# Patient Record
Sex: Male | Born: 1965 | Hispanic: No | Marital: Married | Smoking: Never smoker
Health system: Southern US, Community
[De-identification: ages and names within clinical notes are randomized; demographics above are authoritative.]

## PROBLEM LIST (undated history)

## (undated) DIAGNOSIS — I10 Essential (primary) hypertension: Secondary | ICD-10-CM

## (undated) DIAGNOSIS — E785 Hyperlipidemia, unspecified: Secondary | ICD-10-CM

## (undated) HISTORY — PX: CHOLECYSTECTOMY: SHX55

## (undated) HISTORY — DX: Hyperlipidemia, unspecified: E78.5

## (undated) HISTORY — PX: MOUTH SURGERY: SHX715

---

## 2000-09-27 ENCOUNTER — Emergency Department (HOSPITAL_COMMUNITY): Admission: EM | Admit: 2000-09-27 | Discharge: 2000-09-27 | Payer: Self-pay

## 2002-08-17 ENCOUNTER — Encounter: Payer: Self-pay | Admitting: Emergency Medicine

## 2002-08-17 ENCOUNTER — Emergency Department (HOSPITAL_COMMUNITY): Admission: AC | Admit: 2002-08-17 | Discharge: 2002-08-18 | Payer: Self-pay

## 2008-08-17 ENCOUNTER — Emergency Department (HOSPITAL_COMMUNITY): Admission: EM | Admit: 2008-08-17 | Discharge: 2008-08-17 | Payer: Self-pay | Admitting: Emergency Medicine

## 2009-08-04 ENCOUNTER — Inpatient Hospital Stay (HOSPITAL_COMMUNITY): Admission: EM | Admit: 2009-08-04 | Discharge: 2009-08-06 | Payer: Self-pay | Admitting: Emergency Medicine

## 2009-08-05 ENCOUNTER — Encounter (INDEPENDENT_AMBULATORY_CARE_PROVIDER_SITE_OTHER): Payer: Self-pay | Admitting: Surgery

## 2010-12-09 LAB — DIFFERENTIAL
Basophils Absolute: 0.1 10*3/uL (ref 0.0–0.1)
Eosinophils Relative: 1 % (ref 0–5)
Lymphocytes Relative: 19 % (ref 12–46)
Lymphs Abs: 1.5 10*3/uL (ref 0.7–4.0)
Neutro Abs: 6 10*3/uL (ref 1.7–7.7)
Neutrophils Relative %: 73 % (ref 43–77)

## 2010-12-09 LAB — URINALYSIS, ROUTINE W REFLEX MICROSCOPIC
Bilirubin Urine: NEGATIVE
Ketones, ur: 15 mg/dL — AB
Leukocytes, UA: NEGATIVE
Nitrite: NEGATIVE
Protein, ur: NEGATIVE mg/dL

## 2010-12-09 LAB — COMPREHENSIVE METABOLIC PANEL
AST: 23 U/L (ref 0–37)
BUN: 11 mg/dL (ref 6–23)
CO2: 26 mEq/L (ref 19–32)
Calcium: 8.7 mg/dL (ref 8.4–10.5)
Creatinine, Ser: 1 mg/dL (ref 0.4–1.5)
GFR calc Af Amer: >60 mL/min (ref >60)
GFR calc non Af Amer: >60 mL/min (ref >60)

## 2010-12-09 LAB — LIPASE, BLOOD: Lipase: 18 U/L (ref 11–59)

## 2010-12-09 LAB — CBC
HCT: 52.8 % — ABNORMAL HIGH (ref 39.0–52.0)
MCHC: 34.4 g/dL (ref 30.0–36.0)
MCV: 87.2 fL (ref 78.0–100.0)
RBC: 6.06 MIL/uL — ABNORMAL HIGH (ref 4.22–5.81)

## 2010-12-09 LAB — PROTIME-INR
INR: 1.1 (ref 0.00–1.49)
Prothrombin Time: 14.1 seconds (ref 11.6–15.2)

## 2010-12-09 LAB — APTT: aPTT: 33 seconds (ref 24–37)

## 2011-06-11 LAB — COMPREHENSIVE METABOLIC PANEL
ALT: 37 U/L (ref 0–53)
AST: 28 U/L (ref 0–37)
Albumin: 4.5 g/dL (ref 3.5–5.2)
CO2: 28 mEq/L (ref 19–32)
Calcium: 9.1 mg/dL (ref 8.4–10.5)
Creatinine, Ser: 0.89 mg/dL (ref 0.4–1.5)
GFR calc Af Amer: >60 mL/min (ref >60)
Sodium: 137 mEq/L (ref 135–145)

## 2011-06-11 LAB — CBC
MCHC: 32.7 g/dL (ref 30.0–36.0)
MCV: 88.8 fL (ref 78.0–100.0)
Platelets: 294 10*3/uL (ref 150–400)
RBC: 5.85 MIL/uL — ABNORMAL HIGH (ref 4.22–5.81)
WBC: 10.1 10*3/uL (ref 4.0–10.5)

## 2011-06-11 LAB — DIFFERENTIAL
Eosinophils Absolute: 0 10*3/uL (ref 0.0–0.7)
Eosinophils Relative: 0 % (ref 0–5)
Lymphocytes Relative: 5 % — ABNORMAL LOW (ref 12–46)
Lymphs Abs: 0.5 10*3/uL — ABNORMAL LOW (ref 0.7–4.0)
Monocytes Absolute: 0.1 10*3/uL (ref 0.1–1.0)
Monocytes Relative: 1 % — ABNORMAL LOW (ref 3–12)

## 2011-06-11 LAB — URINALYSIS, ROUTINE W REFLEX MICROSCOPIC
Bilirubin Urine: NEGATIVE
Nitrite: NEGATIVE
Specific Gravity, Urine: 1.024 (ref 1.005–1.030)
Urobilinogen, UA: 1 mg/dL (ref 0.0–1.0)
pH: 7.5 (ref 5.0–8.0)

## 2011-06-11 LAB — URINE MICROSCOPIC-ADD ON

## 2011-08-16 ENCOUNTER — Ambulatory Visit: Payer: Self-pay

## 2012-01-30 ENCOUNTER — Inpatient Hospital Stay (HOSPITAL_COMMUNITY)
Admission: EM | Admit: 2012-01-30 | Discharge: 2012-02-02 | DRG: 566 | Disposition: A | Discharge status: Home / Self Care | Payer: BC Managed Care – PPO | Source: Ambulatory Visit | Attending: Internal Medicine | Admitting: Internal Medicine

## 2012-01-30 ENCOUNTER — Encounter (HOSPITAL_COMMUNITY): Payer: Self-pay | Admitting: *Deleted

## 2012-01-30 DIAGNOSIS — N179 Acute kidney failure, unspecified: Secondary | ICD-10-CM | POA: Diagnosis present

## 2012-01-30 DIAGNOSIS — N12 Tubulo-interstitial nephritis, not specified as acute or chronic: Secondary | ICD-10-CM

## 2012-01-30 DIAGNOSIS — R6510 Systemic inflammatory response syndrome (SIRS) of non-infectious origin without acute organ dysfunction: Secondary | ICD-10-CM | POA: Diagnosis present

## 2012-01-30 DIAGNOSIS — I10 Essential (primary) hypertension: Secondary | ICD-10-CM | POA: Diagnosis present

## 2012-01-30 DIAGNOSIS — R7881 Bacteremia: Secondary | ICD-10-CM | POA: Diagnosis present

## 2012-01-30 DIAGNOSIS — E860 Dehydration: Principal | ICD-10-CM | POA: Diagnosis present

## 2012-01-30 DIAGNOSIS — K5289 Other specified noninfective gastroenteritis and colitis: Secondary | ICD-10-CM | POA: Diagnosis present

## 2012-01-30 HISTORY — DX: Essential (primary) hypertension: I10

## 2012-01-30 MED ORDER — MORPHINE SULFATE 4 MG/ML IJ SOLN
4.0000 mg | Freq: Once | INTRAMUSCULAR | Status: AC
  Administered 2012-01-31: 4 mg via INTRAVENOUS
  Filled 2012-01-30: qty 1

## 2012-01-30 MED ORDER — SODIUM CHLORIDE 0.9 % IV BOLUS (SEPSIS)
1000.0000 mL | Freq: Once | INTRAVENOUS | Status: AC
  Administered 2012-01-31: 1000 mL via INTRAVENOUS

## 2012-01-30 MED ORDER — ONDANSETRON HCL 4 MG/2ML IJ SOLN
4.0000 mg | Freq: Once | INTRAMUSCULAR | Status: AC
  Administered 2012-01-31: 4 mg via INTRAVENOUS
  Filled 2012-01-30: qty 2

## 2012-01-30 NOTE — ED Provider Notes (Signed)
History     CSN: [id number]  Arrival date & time 01/30/12  2227   First MD Initiated Contact with Patient 01/30/12 2335      Chief Complaint  Patient presents with  . Abdominal Pain    (Consider location/radiation/quality/duration/timing/severity/associated sxs/prior treatment) HPI Comments: Pt denies vomiting, bloody stool:pt states that he drinks seldomly:pt states that he has had a decreased appetite:pt states that he has had chills  Patient is a 46 y.o. male presenting with abdominal pain. The history is provided by the patient. No language interpreter was used.  Abdominal Pain The primary symptoms of the illness include abdominal pain and diarrhea. The primary symptoms of the illness do not include fever, nausea or vomiting. The current episode started more than 2 days ago. The onset of the illness was gradual. The problem has not changed since onset.   History reviewed. No pertinent past medical history.  History reviewed. No pertinent past surgical history.  No family history on file.  History  Substance Use Topics  . Smoking status: Never Smoker   . Smokeless tobacco: Not on file  . Alcohol Use: Yes      Review of Systems  Constitutional: Negative for fever.  Respiratory: Negative.   Cardiovascular: Negative.   Gastrointestinal: Positive for abdominal pain and diarrhea. Negative for nausea and vomiting.  Neurological: Negative.     Allergies  Review of patient's allergies indicates no known allergies.  Home Medications   Current Outpatient Rx  Name Route Sig Dispense Refill  . LISINOPRIL-HYDROCHLOROTHIAZIDE 10-12.5 MG PO TABS Oral Take 1 tablet by mouth daily.      BP 120/73  Pulse 113  Temp(Src) 99.2 F (37.3 C) (Oral)  Resp 21  SpO2 96%  Physical Exam  Nursing note and vitals reviewed. Constitutional: He appears well-developed and well-nourished.  HENT:  Head: Normocephalic.  Eyes: EOM are normal. Pupils are equal, round, and reactive to  light.  Neck: Normal range of motion. Neck supple.  Cardiovascular: Normal rate and regular rhythm.   Pulmonary/Chest: Effort normal and breath sounds normal.  Abdominal: Soft. Bowel sounds are normal.       Pt has rlq:no guarding or rebound  Musculoskeletal: He exhibits edema.  Skin: Skin is warm and dry.  Psychiatric: He has a normal mood and affect.    ED Course  Procedures (including critical care time)  Labs Reviewed  COMPREHENSIVE METABOLIC PANEL - Abnormal; Notable for the following:    Glucose, Bld 150 (*)    BUN 37 (*)    Creatinine, Ser 2.56 (*)    Albumin 3.2 (*)    AST 57 (*)    GFR calc non Af Amer 29 (*)    GFR calc Af Amer 33 (*)    All other components within normal limits  CBC - Abnormal; Notable for the following:    WBC 18.5 (*)    Hemoglobin 17.7 (*)    All other components within normal limits  DIFFERENTIAL - Abnormal; Notable for the following:    Neutrophils Relative 88 (*)    Neutro Abs 16.3 (*)    Lymphocytes Relative 4 (*)    Monocytes Absolute 1.4 (*)    All other components within normal limits  LIPASE, BLOOD  URINALYSIS, ROUTINE W REFLEX MICROSCOPIC   No results found.   No diagnosis found.    MDM  Pt care to be followed with Dr.King:pt waiting on ct:pt likely will be admitted related to kidney function  Teressa Lower, NP 01/31/12 [phone]

## 2012-01-30 NOTE — ED Notes (Signed)
The pt has had abd pain with nv  Diarrhea for 5 days

## 2012-01-31 ENCOUNTER — Emergency Department (HOSPITAL_COMMUNITY): Payer: BC Managed Care – PPO

## 2012-01-31 ENCOUNTER — Encounter (HOSPITAL_COMMUNITY): Payer: Self-pay | Admitting: Nurse Practitioner

## 2012-01-31 DIAGNOSIS — R5381 Other malaise: Secondary | ICD-10-CM

## 2012-01-31 DIAGNOSIS — R509 Fever, unspecified: Secondary | ICD-10-CM

## 2012-01-31 DIAGNOSIS — R1084 Generalized abdominal pain: Secondary | ICD-10-CM

## 2012-01-31 DIAGNOSIS — D7289 Other specified disorders of white blood cells: Secondary | ICD-10-CM

## 2012-01-31 LAB — COMPREHENSIVE METABOLIC PANEL
Albumin: 3.2 g/dL — ABNORMAL LOW (ref 3.5–5.2)
Alkaline Phosphatase: 57 U/L (ref 39–117)
BUN: 37 mg/dL — ABNORMAL HIGH (ref 6–23)
CO2: 25 mEq/L (ref 19–32)
Chloride: 96 mEq/L (ref 96–112)
GFR calc non Af Amer: 29 mL/min — ABNORMAL LOW (ref >90)
Potassium: 4.1 mEq/L (ref 3.5–5.1)
Total Bilirubin: 0.9 mg/dL (ref 0.3–1.2)

## 2012-01-31 LAB — DIFFERENTIAL
Basophils Absolute: 0.1 10*3/uL (ref 0.0–0.1)
Basophils Relative: 0 % (ref 0–1)
Eosinophils Relative: 0 % (ref 0–5)
Monocytes Absolute: 1.4 10*3/uL — ABNORMAL HIGH (ref 0.1–1.0)
Neutro Abs: 16.3 10*3/uL — ABNORMAL HIGH (ref 1.7–7.7)

## 2012-01-31 LAB — HIV ANTIBODY (ROUTINE TESTING W REFLEX): HIV: NONREACTIVE

## 2012-01-31 LAB — URINALYSIS, ROUTINE W REFLEX MICROSCOPIC
Glucose, UA: NEGATIVE mg/dL
Ketones, ur: 15 mg/dL — AB
Nitrite: NEGATIVE
Specific Gravity, Urine: 1.025 (ref 1.005–1.030)
pH: 5 (ref 5.0–8.0)

## 2012-01-31 LAB — CBC
HCT: 46.7 % (ref 39.0–52.0)
HCT: 50.5 % (ref 39.0–52.0)
Hemoglobin: 16 g/dL (ref 13.0–17.0)
MCHC: 34.3 g/dL (ref 30.0–36.0)
MCHC: 35 g/dL (ref 30.0–36.0)
MCV: 86.8 fL (ref 78.0–100.0)
MCV: 87.1 fL (ref 78.0–100.0)
Platelets: 191 10*3/uL (ref 150–400)
RDW: 13.3 % (ref 11.5–15.5)

## 2012-01-31 LAB — SODIUM, URINE, RANDOM: Sodium, Ur: 28 mEq/L

## 2012-01-31 LAB — BASIC METABOLIC PANEL
BUN: 40 mg/dL — ABNORMAL HIGH (ref 6–23)
Chloride: 96 mEq/L (ref 96–112)
Creatinine, Ser: 2.15 mg/dL — ABNORMAL HIGH (ref 0.50–1.35)
GFR calc non Af Amer: 35 mL/min — ABNORMAL LOW (ref >90)
Glucose, Bld: 92 mg/dL (ref 70–99)
Potassium: 3.6 mEq/L (ref 3.5–5.1)

## 2012-01-31 LAB — URINE MICROSCOPIC-ADD ON

## 2012-01-31 LAB — RAPID URINE DRUG SCREEN, HOSP PERFORMED
Barbiturates: NOT DETECTED
Benzodiazepines: NOT DETECTED

## 2012-01-31 LAB — LIPASE, BLOOD: Lipase: 28 U/L (ref 11–59)

## 2012-01-31 LAB — GLUCOSE, CAPILLARY
Glucose-Capillary: 105 mg/dL — ABNORMAL HIGH (ref 70–99)
Glucose-Capillary: 102 mg/dL — ABNORMAL HIGH (ref 70–99)

## 2012-01-31 LAB — HEMOGLOBIN A1C
Hgb A1c MFr Bld: 5.3 % (ref <5.7)
Mean Plasma Glucose: 105 mg/dL (ref <117)

## 2012-01-31 LAB — CLOSTRIDIUM DIFFICILE BY PCR: Toxigenic C. Difficile by PCR: NEGATIVE

## 2012-01-31 LAB — CREATININE, URINE, RANDOM: Creatinine, Urine: 226.73 mg/dL

## 2012-01-31 MED ORDER — METRONIDAZOLE IN NACL 5-0.79 MG/ML-% IV SOLN
500.0000 mg | Freq: Three times a day (TID) | INTRAVENOUS | Status: DC
  Filled 2012-01-31 (×2): qty 100

## 2012-01-31 MED ORDER — ONDANSETRON HCL 4 MG PO TABS
4.0000 mg | ORAL_TABLET | Freq: Four times a day (QID) | ORAL | Status: DC | PRN
  Filled 2012-01-31: qty 0.5

## 2012-01-31 MED ORDER — DEXTROSE 5 % IV SOLN: 1.0000 g | INTRAVENOUS | Status: DC

## 2012-01-31 MED ORDER — VANCOMYCIN 50 MG/ML ORAL SOLUTION
125.0000 mg | Freq: Four times a day (QID) | ORAL | Status: DC
  Administered 2012-01-31: 125 mg via ORAL
  Filled 2012-01-31 (×4): qty 2.5

## 2012-01-31 MED ORDER — MORPHINE SULFATE 2 MG/ML IJ SOLN: 1.0000 mg | INTRAMUSCULAR | Status: DC | PRN

## 2012-01-31 MED ORDER — CIPROFLOXACIN IN D5W 400 MG/200ML IV SOLN
400.0000 mg | Freq: Two times a day (BID) | INTRAVENOUS | Status: DC
  Administered 2012-01-31 – 2012-02-01 (×4): 400 mg via INTRAVENOUS
  Filled 2012-01-31 (×6): qty 200

## 2012-01-31 MED ORDER — ACETAMINOPHEN 325 MG PO TABS
650.0000 mg | ORAL_TABLET | Freq: Four times a day (QID) | ORAL | Status: DC | PRN
  Administered 2012-01-31 – 2012-02-02 (×5): 650 mg via ORAL
  Filled 2012-01-31 (×5): qty 2

## 2012-01-31 MED ORDER — ENOXAPARIN SODIUM 30 MG/0.3ML ~~LOC~~ SOLN
30.0000 mg | SUBCUTANEOUS | Status: DC
  Administered 2012-01-31 – 2012-02-01 (×2): 30 mg via SUBCUTANEOUS
  Filled 2012-01-31 (×4): qty 0.3

## 2012-01-31 MED ORDER — METRONIDAZOLE IN NACL 5-0.79 MG/ML-% IV SOLN
500.0000 mg | Freq: Three times a day (TID) | INTRAVENOUS | Status: DC
  Administered 2012-01-31 – 2012-02-02 (×6): 500 mg via INTRAVENOUS
  Filled 2012-01-31 (×8): qty 100

## 2012-01-31 MED ORDER — DEXTROSE 5 % IV SOLN
1.0000 g | Freq: Once | INTRAVENOUS | Status: AC
  Administered 2012-01-31: 1 g via INTRAVENOUS
  Filled 2012-01-31: qty 10

## 2012-01-31 MED ORDER — HYDROCODONE-ACETAMINOPHEN 5-325 MG PO TABS
1.0000 | ORAL_TABLET | ORAL | Status: DC | PRN
  Filled 2012-01-31: qty 2

## 2012-01-31 MED ORDER — SODIUM CHLORIDE 0.9 % IV SOLN
INTRAVENOUS | Status: DC
  Administered 2012-01-31: 06:00:00 via INTRAVENOUS
  Administered 2012-01-31: 125 mL/h via INTRAVENOUS
  Administered 2012-02-01: 02:00:00 via INTRAVENOUS
  Administered 2012-02-01: 1000 mL via INTRAVENOUS
  Administered 2012-02-01: 13:00:00 via INTRAVENOUS

## 2012-01-31 MED ORDER — ONDANSETRON HCL 4 MG/2ML IJ SOLN: 4.0000 mg | Freq: Four times a day (QID) | INTRAMUSCULAR | Status: DC | PRN

## 2012-01-31 MED ORDER — ACETAMINOPHEN 325 MG PO TABS
650.0000 mg | ORAL_TABLET | Freq: Once | ORAL | Status: AC
  Administered 2012-01-31: 650 mg via ORAL
  Filled 2012-01-31: qty 2

## 2012-01-31 MED ORDER — IOHEXOL 300 MG/ML  SOLN
20.0000 mL | INTRAMUSCULAR | Status: DC
  Administered 2012-01-31: 20 mL via ORAL

## 2012-01-31 NOTE — ED Provider Notes (Signed)
Medical screening examination/treatment/procedure(s) were performed by non-physician practitioner and as supervising physician I was immediately available for consultation/collaboration.   Dayton Bailiff, MD 01/31/12 [phone]

## 2012-01-31 NOTE — Progress Notes (Addendum)
Subjective: Seen and examined this morning. He informs of  having abdominal pain mainly in the lower quadrants. Denies having any flank pain or urinary symptoms. He informs of having about 9-10 episodes of watery diarrhea for past 3 days and feeling very nauseous at the same time. Loss of chills. Denies any sick contacts or recent travel. Denies being on antibiotics recently.  Objective:  Vital signs in last 24 hours:  Filed Vitals:   01/30/12 2236 01/31/12 0358 01/31/12 0530 01/31/12 0938  BP: 120/73 106/59 108/66 104/56  Pulse: 113 103 88 102  Temp: 99.2 F (37.3 C) 102.4 F (39.1 C) 98.4 F (36.9 C) 102.9 F (39.4 C)  TempSrc: Oral Oral Oral Oral  Resp: 21 20 20 18   Height:   5\' 10"  (1.778 m)   Weight:   95.029 kg (209 lb 8 oz)   SpO2: 96% 96% 97% 98%    Intake/Output from previous day:  No intake or output data in the 24 hours ending 01/31/12 1329  Physical Exam:  General: Middle-aged male in no acute distress. HEENT: no pallor, no icterus, dry oral mucosa, no JVD, no lymphadenopathy Heart: Normal  s1 &s2  Regular rate and rhythm, without murmurs, rubs, gallops. Lungs: Clear to auscultation bilaterally. Abdomen: Soft, mild tenderness to palpation over right lower quadrant, nondistended, positive bowel sounds. Extremities: No clubbing cyanosis or edema with positive pedal pulses. Neuro: Alert, awake, oriented x3, nonfocal.   Lab Results:  Basic Metabolic Panel:    Component Value Date/Time   NA 136 01/30/2012 2341   K 4.1 01/30/2012 2341   CL 96 01/30/2012 2341   CO2 25 01/30/2012 2341   BUN 37* 01/30/2012 2341   CREATININE 2.56* 01/30/2012 2341   GLUCOSE 150* 01/30/2012 2341   CALCIUM 9.1 01/30/2012 2341   CBC:    Component Value Date/Time   WBC 16.9* 01/31/2012 0950   HGB 16.0 01/31/2012 0950   HCT 46.7 01/31/2012 0950   PLT 208 01/31/2012 0950   MCV 86.8 01/31/2012 0950   NEUTROABS 16.3* 01/30/2012 2341   LYMPHSABS 0.8 01/30/2012 2341   MONOABS 1.4* 01/30/2012 2341    EOSABS 0.0 01/30/2012 2341   BASOSABS 0.1 01/30/2012 2341    Recent Results (from the past 240 hour(s))  CLOSTRIDIUM DIFFICILE BY PCR     Status: Normal   Collection Time   01/31/12 10:33 AM      Component Value Range Status Comment   C difficile by pcr NEGATIVE  NEGATIVE  Final     Studies/Results: Ct Abdomen Pelvis Wo Contrast  01/31/2012  *RADIOLOGY REPORT*  Clinical Data: Decreased appetite.  Chills.  Abdominal pain. Diarrhea.  CT ABDOMEN AND PELVIS WITHOUT CONTRAST  Technique:  Multidetector CT imaging of the abdomen and pelvis was performed following the standard protocol without intravenous contrast.  Comparison: 08/04/2009  Findings: The lung bases are clear.  There is an infiltrative process involving the inferior retroperitoneal fat and extending along the iliac vessels into the pelvis with presacral and perirectal involvement.  There appear to be enlarged perirectal lymph nodes.  This is likely represent inflammatory stranding although retroperitoneal hematoma can also have this appearance. The appendix is normal.  There is no obvious evidence of diverticulitis.  The abdominal aorta is normal in caliber.  No lumbar endplate bone destruction to suggest diskitis. The source of the inflammatory processes is clear. No pyelocaliectasis or ureterectasis.  Changes are new since the previous study.  The surgical absence of the gallbladder.  Enlarged lymph node  in the para-esophageal fat at the abdominal inlet measuring 12 mm diameter.  The unenhanced appearance of the liver, spleen, pancreas, adrenal glands, kidneys, abdominal aorta, and retroperitoneal lymph nodes is unremarkable.  The stomach and small bowel are not distended.  No free air or free fluid in the abdomen.  Pelvis:  In addition to the previously described findings, the prostate gland appears mildly enlarged.  The bladder wall is not thickened.  No free or loculated pelvic fluid collections.  Normal alignment of the lumbar vertebra  with mild degenerative change.  IMPRESSION: Inferior retroperitoneal, pelvic, and perirectal stranding consistent with inflammatory process or hematoma.  The source is not identified.  The appendix and sigmoid colon are unremarkable. There is evidence of prominent lymph nodes in the perirectal fat and adjacent to the distal esophagus.  Original Report Authenticated By: Marlon Pel, M.D.    Medications: Scheduled Meds:   . acetaminophen  650 mg Oral Once  . cefTRIAXone (ROCEPHIN)  IV  1 g Intravenous Once  . ciprofloxacin  400 mg Intravenous Q12H  . enoxaparin  30 mg Subcutaneous Q24H  .  morphine injection  4 mg Intravenous Once  . ondansetron (ZOFRAN) IV  4 mg Intravenous Once  . sodium chloride  1,000 mL Intravenous Once  . vancomycin  125 mg Oral QID  . DISCONTD: cefTRIAXone (ROCEPHIN)  IV  1 g Intravenous Q24H  . DISCONTD: iohexol  20 mL Oral Q1 Hr x 2  . DISCONTD: metronidazole  500 mg Intravenous Q8H   Continuous Infusions:   . sodium chloride 125 mL/hr at 01/31/12 0600   PRN Meds:.acetaminophen, HYDROcodone-acetaminophen, morphine injection, ondansetron (ZOFRAN) IV, ondansetron  Assessment/  46 year old male with history off hypertension presenting with 3 days off abdominal pain mainly involving the lower quadrant associated with several episodes of watery diarrhea and the nausea. Patient noted to be febrile in along with significant leukocytosis with predominant neutrophils with CT abdomen and pelvis showing inferior retroperitoneal, pelvic, and perirectal stranding consistent with an inflammatory process without a definite source.   Plan:  SIRS [systemic inflammatory response syndrome] Patient hasn't high fever with mild tachycardia and significant leukocytosis meeting criteria for SIRS Symptoms possibly related to acute enteritis/enterocolitis Patient was initiated on IV Rocephin and with concern for pyelonephritis. However this does not have any urinary symptoms  and UA is not convincing of a UTI or CVA tenderness on exam. He was quite febrile even this morning. Will change the antibiotic coverage to IV ciprofloxacin and Flagyl His stool for C. difficile is negative, stool culture and stool for ova parasites is pending Ordered blood culture and urine culture The new with IV hydration and Tylenol as needed for fever IV Zofran for nausea  Acute kidney injury Possibly related to dehydration Fe N.a pending Monitor urine output and follow with renal function And on HCTZ and lisinopril for this or pressure which I will hold  Hypertension Hold  Blood pressure medication  Diet clear liquids  D V T prophylaxis  Full code    LOS: 1 day   Gwenith Tschida 01/31/2012, 1:29 PM   FeNa of 0.23

## 2012-01-31 NOTE — H&P (Signed)
PCP:  Sheila Oats, MD, MD   DOA:  01/30/2012 10:33 PM  Chief Complaint:  Abdominal pain  HPI: Pt is 45 yo male who presents to [facility] ED with main concern of progressively worsening generalized lower quadrant, dull abdominal pain, intermittent in nature, radiating to bilateral flank area, 10/10 in severity when present with no specific aggravating or alleviating factors. The episodes have been associated with subjective fevers, chills, nauseam poor oral intake, dysuria and sensation of incomplete voiding. Pt denies similar episodes in the past. He also denies any recent sicknesses of hospitalizations, no sick contacts or exposures, no other systemic symptoms of weight loss or gain. Denies headaches, visual changes.  Allergies: No Known Allergies  Prior to Admission medications   Medication Sig Start Date End Date Taking? Authorizing Provider  lisinopril-hydrochlorothiazide (PRINZIDE,ZESTORETIC) 10-12.5 MG per tablet Take 1 tablet by mouth daily.   Yes Historical Provider, MD    Past Medical History  Diagnosis Date  . Hypertension     Past Surgical History  Procedure Date  . Cholecystectomy     Social History:  reports that he has never smoked. He does not have any smokeless tobacco history on file. He reports that he drinks alcohol. His drug history not on file.  No family history on file.  Review of Systems:  Constitutional: Denies fatigue HEENT: Denies photophobia, eye pain, redness, hearing loss, ear pain, congestion, sore throat, rhinorrhea, sneezing, mouth sores, trouble swallowing, neck pain, neck stiffness and tinnitus.   Respiratory: Denies SOB, DOE, cough, chest tightness,  and wheezing.   Cardiovascular: Denies chest pain, palpitations and leg swelling.  Gastrointestinal: Denies constipation, blood in stool and abdominal distention.  Genitourinary: per HPI Musculoskeletal: Denies myalgias, back pain, joint swelling, arthralgias and gait problem.  Skin: Denies  pallor, rash and wound.  Neurological: Denies dizziness, seizures, syncope, weakness, light-headedness, numbness and headaches.  Hematological: Denies adenopathy. Easy bruising, personal or family bleeding history  Psychiatric/Behavioral: Denies suicidal ideation, mood changes, confusion, nervousness, sleep disturbance and agitation   Physical Exam:  Filed Vitals:   01/30/12 2236 01/31/12 0358  BP: 120/73 106/59  Pulse: 113 103  Temp: 99.2 F (37.3 C) 102.4 F (39.1 C)  TempSrc: Oral Oral  Resp: 21 20  SpO2: 96% 96%    Constitutional: Vital signs reviewed.  Patient is in no acute distress and cooperative with exam. Alert and oriented x3.  Head: Normocephalic and atraumatic Ear: TM normal bilaterally Mouth: no erythema or exudates, MMM Eyes: PERRL, EOMI, conjunctivae normal, No scleral icterus.  Neck: Supple, Trachea midline normal ROM, No JVD, mass, thyromegaly, or carotid bruit present.  Cardiovascular: Regular rhythm but tachycardic, S1 normal, S2 normal, no MRG, pulses symmetric and intact bilaterally Pulmonary/Chest: CTAB, no wheezes, rales, or rhonchi Abdominal: Soft. Tender in epigastric area, non-distended, bowel sounds are normal, no masses, organomegaly, or guarding present.  GU: bilateral mild CVA tenderness Musculoskeletal: No oint deformities, erythema, or stiffness, ROM full and no nontender Ext: no edema and no cyanosis, pulses palpable bilaterally (DP and PT) Hematology: no cervical, inginal, or axillary adenopathy.  Neurological: A&O x3, Strenght is normal and symmetric bilaterally, cranial nerve II-XII are grossly intact, no focal motor deficit, sensory intact to light touch bilaterally.  Skin: Warm, dry and intact. No rash, cyanosis, or clubbing.  Psychiatric: Normal mood and affect. speech and behavior is normal. Judgment and thought content normal. Cognition and memory are normal.   Labs on Admission:  Results for orders placed during the hospital encounter  of 01/30/12 (from  the past 48 hour(s))  COMPREHENSIVE METABOLIC PANEL     Status: Abnormal   Collection Time   01/30/12 11:41 PM      Component Value Range Comment   Sodium 136  135 - 145 (mEq/L)    Potassium 4.1  3.5 - 5.1 (mEq/L)    Chloride 96  96 - 112 (mEq/L)    CO2 25  19 - 32 (mEq/L)    Glucose, Bld 150 (*) 70 - 99 (mg/dL)    BUN 37 (*) 6 - 23 (mg/dL)    Creatinine, Ser 8.29 (*) 0.50 - 1.35 (mg/dL)    Calcium 9.1  8.4 - 10.5 (mg/dL)    Total Protein 7.9  6.0 - 8.3 (g/dL)    Albumin 3.2 (*) 3.5 - 5.2 (g/dL)    AST 57 (*) 0 - 37 (U/L)    ALT 44  0 - 53 (U/L)    Alkaline Phosphatase 57  39 - 117 (U/L)    Total Bilirubin 0.9  0.3 - 1.2 (mg/dL)    GFR calc non Af Amer 29 (*) >90 (mL/min)    GFR calc Af Amer 33 (*) >90 (mL/min)   LIPASE, BLOOD     Status: Normal   Collection Time   01/30/12 11:41 PM      Component Value Range Comment   Lipase 28  11 - 59 (U/L)   CBC     Status: Abnormal   Collection Time   01/30/12 11:41 PM      Component Value Range Comment   WBC 18.5 (*) 4.0 - 10.5 (K/uL)    RBC 5.80  4.22 - 5.81 (MIL/uL)    Hemoglobin 17.7 (*) 13.0 - 17.0 (g/dL)    HCT 56.2  13.0 - 86.5 (%)    MCV 87.1  78.0 - 100.0 (fL)    MCH 30.5  26.0 - 34.0 (pg)    MCHC 35.0  30.0 - 36.0 (g/dL)    RDW 78.4  69.6 - 29.5 (%)    Platelets 191  150 - 400 (K/uL)   DIFFERENTIAL     Status: Abnormal   Collection Time   01/30/12 11:41 PM      Component Value Range Comment   Neutrophils Relative 88 (*) 43 - 77 (%)    Neutro Abs 16.3 (*) 1.7 - 7.7 (K/uL)    Lymphocytes Relative 4 (*) 12 - 46 (%)    Lymphs Abs 0.8  0.7 - 4.0 (K/uL)    Monocytes Relative 8  3 - 12 (%)    Monocytes Absolute 1.4 (*) 0.1 - 1.0 (K/uL)    Eosinophils Relative 0  0 - 5 (%)    Eosinophils Absolute 0.0  0.0 - 0.7 (K/uL)    Basophils Relative 0  0 - 1 (%)    Basophils Absolute 0.1  0.0 - 0.1 (K/uL)   URINALYSIS, ROUTINE W REFLEX MICROSCOPIC     Status: Abnormal   Collection Time   01/31/12  3:18 AM       Component Value Range Comment   Color, Urine AMBER (*) YELLOW  BIOCHEMICALS MAY BE AFFECTED BY COLOR   APPearance TURBID (*) CLEAR     Specific Gravity, Urine 1.025  1.005 - 1.030     pH 5.0  5.0 - 8.0     Glucose, UA NEGATIVE  NEGATIVE (mg/dL)    Hgb urine dipstick LARGE (*) NEGATIVE     Bilirubin Urine SMALL (*) NEGATIVE     Ketones, ur 15 (*) NEGATIVE (  mg/dL)    Protein, ur 119 (*) NEGATIVE (mg/dL)    Urobilinogen, UA 1.0  0.0 - 1.0 (mg/dL)    Nitrite NEGATIVE  NEGATIVE     Leukocytes, UA SMALL (*) NEGATIVE    URINE MICROSCOPIC-ADD ON     Status: Abnormal   Collection Time   01/31/12  3:18 AM      Component Value Range Comment   Squamous Epithelial / LPF FEW (*) RARE     WBC, UA 11-20  <3 (WBC/hpf)    RBC / HPF 21-50  <3 (RBC/hpf)    Bacteria, UA FEW (*) RARE     Casts HYALINE CASTS (*) NEGATIVE  GRANULAR CAST   Urine-Other AMORPHOUS URATES/PHOSPHATES       Radiological Exams on Admission:  CT ABDOMEN AND PELVIS 01/31/2012 IMPRESSION:  Inferior retroperitoneal, pelvic, and perirectal stranding  consistent with inflammatory process or hematoma. The source is  not identified. The appendix and sigmoid colon are unremarkable.  There is evidence of prominent lymph nodes in the perirectal fat  and adjacent to the distal esophagus.   Assessment/Plan  Abdominal pain - unclear etiology at this time but likely secondary to pyelonephritis - will admit the pt to medical floor for further evaluation and management - provide supportive care with IVF, antiemetics, analgesia - will continue Rocephin IV daily  - follow upon urine cultures  Leukocytosis - most likely secondary to principal problem noted above - continue antibiotic, may need broader coverage if no clinical improvement noted over 24 hour period - check procalcitonin level - CBC in AM  Hyperglycemia - will check A1C  Transaminitis - mild - will check alcohol level - CMET in AM  ARF - most likely pre renal  etiology - will continue IVF for now and obtain BMP in AM - obtain urine sodium and urine creatinine   DVT Prophylaxis -   Code Status -   Education  - test results and diagnostic studies were discussed with patient and pt's family who was present at the bedside - patient and family have verbalized the understanding - questions were answered at the bedside and contact information was provided for additional questions or concerns  Time Spent on Admission: Over 30 minutes  MAGICK-Adger Cantera 01/31/2012, 5:05 AM  [facility] Pager # [phone] Main Office # [phone]

## 2012-02-01 DIAGNOSIS — K529 Noninfective gastroenteritis and colitis, unspecified: Secondary | ICD-10-CM

## 2012-02-01 DIAGNOSIS — R6510 Systemic inflammatory response syndrome (SIRS) of non-infectious origin without acute organ dysfunction: Secondary | ICD-10-CM

## 2012-02-01 DIAGNOSIS — D7289 Other specified disorders of white blood cells: Secondary | ICD-10-CM

## 2012-02-01 DIAGNOSIS — R1084 Generalized abdominal pain: Secondary | ICD-10-CM

## 2012-02-01 LAB — CBC
HCT: 42.6 % (ref 39.0–52.0)
Hemoglobin: 14.9 g/dL (ref 13.0–17.0)
MCH: 30.1 pg (ref 26.0–34.0)
MCHC: 35 g/dL (ref 30.0–36.0)
MCV: 86.1 fL (ref 78.0–100.0)
Platelets: 194 10*3/uL (ref 150–400)
RBC: 4.95 MIL/uL (ref 4.22–5.81)
RDW: 13.8 % (ref 11.5–15.5)
WBC: 14.7 10*3/uL — ABNORMAL HIGH (ref 4.0–10.5)

## 2012-02-01 LAB — COMPREHENSIVE METABOLIC PANEL WITH GFR
ALT: 92 U/L — ABNORMAL HIGH (ref 0–53)
AST: 131 U/L — ABNORMAL HIGH (ref 0–37)
Albumin: 2.6 g/dL — ABNORMAL LOW (ref 3.5–5.2)
Alkaline Phosphatase: 67 U/L (ref 39–117)
BUN: 28 mg/dL — ABNORMAL HIGH (ref 6–23)
CO2: 27 meq/L (ref 19–32)
Calcium: 8.4 mg/dL (ref 8.4–10.5)
Chloride: 100 meq/L (ref 96–112)
Creatinine, Ser: 1.84 mg/dL — ABNORMAL HIGH (ref 0.50–1.35)
GFR calc Af Amer: 49 mL/min — ABNORMAL LOW
GFR calc non Af Amer: 43 mL/min — ABNORMAL LOW
Glucose, Bld: 101 mg/dL — ABNORMAL HIGH (ref 70–99)
Potassium: 3.5 meq/L (ref 3.5–5.1)
Sodium: 136 meq/L (ref 135–145)
Total Bilirubin: 1.8 mg/dL — ABNORMAL HIGH (ref 0.3–1.2)
Total Protein: 6.7 g/dL (ref 6.0–8.3)

## 2012-02-01 LAB — URINE CULTURE
Colony Count: NO GROWTH
Culture  Setup Time: 201305271124

## 2012-02-01 NOTE — Progress Notes (Addendum)
Subjective: patient seen and examined this morning. Has 2-3 episodes of diarrhea yesterday.s till has some RLQ pain but improving. Had temp spikes yesterday but remains afebrile overnight. Still feels weak  Objective:  Vital signs in last 24 hours:  Filed Vitals:   01/31/12 2051 02/01/12 0456 02/01/12 0843 02/01/12 1312  BP: 128/80 130/74 103/65 129/84  Pulse: 88 87 74 79  Temp: 97.7 F (36.5 C) 100.7 F (38.2 C) 98.6 F (37 C) 99.5 F (37.5 C)  TempSrc: Oral Oral Oral Oral  Resp: 18 18 18 18   Height: 5\' 10"  (1.778 m)     Weight: 96.3 kg (212 lb 4.9 oz)     SpO2: 96% 94% 93% 95%    Intake/Output from previous day:   Intake/Output Summary (Last 24 hours) at 02/01/12 1346 Last data filed at 02/01/12 1313  Gross per 24 hour  Intake   3587 ml  Output      5 ml  Net   3582 ml    Physical Exam:  General: Middle-aged male in no acute distress.  HEENT: no pallor, no icterus, dry oral mucosa, no JVD, no lymphadenopathy  Heart: Normal s1 &s2 Regular rate and rhythm, without murmurs, rubs, gallops.  Lungs: Clear to auscultation bilaterally.  Abdomen: Soft, mild tenderness to palpation over right lower quadrant, nondistended, positive bowel sounds.  Extremities: No clubbing cyanosis or edema with positive pedal pulses.  Neuro: Alert, awake, oriented x3, nonfocal.    Lab Results:  Basic Metabolic Panel:    Component Value Date/Time   NA 136 02/01/2012 0440   K 3.5 02/01/2012 0440   CL 100 02/01/2012 0440   CO2 27 02/01/2012 0440   BUN 28* 02/01/2012 0440   CREATININE 1.84* 02/01/2012 0440   GLUCOSE 101* 02/01/2012 0440   CALCIUM 8.4 02/01/2012 0440   CBC:    Component Value Date/Time   WBC 14.7* 02/01/2012 0440   HGB 14.9 02/01/2012 0440   HCT 42.6 02/01/2012 0440   PLT 194 02/01/2012 0440   MCV 86.1 02/01/2012 0440   NEUTROABS 16.3* 01/30/2012 2341   LYMPHSABS 0.8 01/30/2012 2341   MONOABS 1.4* 01/30/2012 2341   EOSABS 0.0 01/30/2012 2341   BASOSABS 0.1 01/30/2012 2341     Recent Results (from the past 240 hour(s))  CLOSTRIDIUM DIFFICILE BY PCR     Status: Normal   Collection Time   01/31/12 10:33 AM      Component Value Range Status Comment   C difficile by pcr NEGATIVE  NEGATIVE  Final   STOOL CULTURE     Status: Normal (Preliminary result)   Collection Time   01/31/12 10:33 AM      Component Value Range Status Comment   Specimen Description STOOL   Final    Special Requests NONE   Final    Culture Culture reincubated for better growth   Final    Report Status PENDING   Incomplete   CULTURE, BLOOD (ROUTINE X 2)     Status: Normal (Preliminary result)   Collection Time   01/31/12 10:40 AM      Component Value Range Status Comment   Specimen Description BLOOD LEFT ARM   Final    Special Requests BOTTLES DRAWN AEROBIC ONLY 6 CC   Final    Culture  Setup Time [id number]   Final    Culture     Final    Value:        BLOOD CULTURE RECEIVED NO GROWTH TO DATE CULTURE WILL BE  HELD FOR 5 DAYS BEFORE ISSUING A FINAL NEGATIVE REPORT   Report Status PENDING   Incomplete     Studies/Results: Ct Abdomen Pelvis Wo Contrast  01/31/2012  *RADIOLOGY REPORT*  Clinical Data: Decreased appetite.  Chills.  Abdominal pain. Diarrhea.  CT ABDOMEN AND PELVIS WITHOUT CONTRAST  Technique:  Multidetector CT imaging of the abdomen and pelvis was performed following the standard protocol without intravenous contrast.  Comparison: 08/04/2009  Findings: The lung bases are clear.  There is an infiltrative process involving the inferior retroperitoneal fat and extending along the iliac vessels into the pelvis with presacral and perirectal involvement.  There appear to be enlarged perirectal lymph nodes.  This is likely represent inflammatory stranding although retroperitoneal hematoma can also have this appearance. The appendix is normal.  There is no obvious evidence of diverticulitis.  The abdominal aorta is normal in caliber.  No lumbar endplate bone destruction to suggest diskitis.  The source of the inflammatory processes is clear. No pyelocaliectasis or ureterectasis.  Changes are new since the previous study.  The surgical absence of the gallbladder.  Enlarged lymph node in the para-esophageal fat at the abdominal inlet measuring 12 mm diameter.  The unenhanced appearance of the liver, spleen, pancreas, adrenal glands, kidneys, abdominal aorta, and retroperitoneal lymph nodes is unremarkable.  The stomach and small bowel are not distended.  No free air or free fluid in the abdomen.  Pelvis:  In addition to the previously described findings, the prostate gland appears mildly enlarged.  The bladder wall is not thickened.  No free or loculated pelvic fluid collections.  Normal alignment of the lumbar vertebra with mild degenerative change.  IMPRESSION: Inferior retroperitoneal, pelvic, and perirectal stranding consistent with inflammatory process or hematoma.  The source is not identified.  The appendix and sigmoid colon are unremarkable. There is evidence of prominent lymph nodes in the perirectal fat and adjacent to the distal esophagus.  Original Report Authenticated By: Marlon Pel, M.D.    Medications: Scheduled Meds:   . ciprofloxacin  400 mg Intravenous Q12H  . enoxaparin  30 mg Subcutaneous Q24H  . metronidazole  500 mg Intravenous Q8H   Continuous Infusions:   . sodium chloride 125 mL/hr at 02/01/12 1243   PRN Meds:.acetaminophen, HYDROcodone-acetaminophen, morphine injection, ondansetron (ZOFRAN) IV, ondansetron  Assessment/  46 year old male with history of hypertension, hx of  cholecystectomy 3 years back presenting with 3 days of abdominal pain mainly involving the lower quadrant associated with several episodes of watery diarrhea and the nausea. Patient noted to be febrile in along with significant leukocytosis with predominant neutrophils with CT abdomen and pelvis showing inferior retroperitoneal, pelvic, and perirectal stranding consistent with an  inflammatory process without a definite source.   Plan:   SIRS [systemic inflammatory response syndrome]  -Patient had high fever with mild tachycardia and significant leukocytosis meeting criteria for SIRS on admission. -Symptoms possibly related to acute enteritis/enterocolitis with symptomatic diarrhea and CT abdomen showing pelvic and pararectal stranding.   -Patient was initiated on IV Rocephin and with concern for pyelonephritis. However this does not have any urinary symptoms and UA is not convincing for  a UTI. No  CVA tenderness on exam.  - changed antibiotic coverage to IV ciprofloxacin and Flagyl on 5/27 ( day 2)  -His stool for C. difficile is negative, stool culture and stool for ova parasites is pending  -Ordered blood culture and urine culture pending -continue  with IV hydration and Tylenol as needed for fever -Remains afebrile  overnight   Acute kidney injury  Possibly related to dehydration and also on HCTZ-lisinopril for his BP Fe N.a <1 on admission  renal function slowly improving D/c HCTZ-lisinopril   Hypertension  Hold Blood pressure medication   Diet : Advance to regular   D V T prophylaxis    Full code   Pending stool studies and culture results. Please call GI if symptoms worsen   LOS: 2 days   Phoua Hoadley 02/01/2012, 1:46 PM

## 2012-02-01 NOTE — Progress Notes (Signed)
   CARE MANAGEMENT NOTE 02/01/2012  Patient:  Dylan Garner, Dylan Garner   Account Number:  [id number]  Date Initiated:  02/01/2012  Documentation initiated by:  Darlyne Russian  Subjective/Objective Assessment:   Patient admitted with abdominal pain     Action/Plan:   Progression of care and discharge planning   Anticipated DC Date:  02/03/2012   Anticipated DC Plan:  HOME/SELF CARE      DC Planning Services  CM consult      Choice offered to / List presented to:             Status of service:  In process, will continue to follow Medicare Important Message given?   (If response is "NO", the following Medicare IM given date fields will be blank) Date Medicare IM given:   Date Additional Medicare IM given:    Discharge Disposition:    Per UR Regulation:    If discussed at Long Length of Stay Meetings, dates discussed:    Comments:  02/01/2012 1000 Darlyne Russian RN, [place] [phone]  Met with patient to discuss CM and discharge planning. He lives at home with spouse, no issues regarding MD follow up, medication costs or transportation.  CM to continue to follow for discharge planning needs.

## 2012-02-02 DIAGNOSIS — K529 Noninfective gastroenteritis and colitis, unspecified: Secondary | ICD-10-CM

## 2012-02-02 DIAGNOSIS — R6510 Systemic inflammatory response syndrome (SIRS) of non-infectious origin without acute organ dysfunction: Secondary | ICD-10-CM

## 2012-02-02 DIAGNOSIS — D7289 Other specified disorders of white blood cells: Secondary | ICD-10-CM

## 2012-02-02 DIAGNOSIS — R1084 Generalized abdominal pain: Secondary | ICD-10-CM

## 2012-02-02 LAB — CBC
Platelets: 212 10*3/uL (ref 150–400)
RBC: 4.84 MIL/uL (ref 4.22–5.81)
WBC: 12.3 10*3/uL — ABNORMAL HIGH (ref 4.0–10.5)

## 2012-02-02 LAB — BASIC METABOLIC PANEL
CO2: 27 mEq/L (ref 19–32)
Calcium: 8.2 mg/dL — ABNORMAL LOW (ref 8.4–10.5)
Chloride: 104 mEq/L (ref 96–112)
Sodium: 139 mEq/L (ref 135–145)

## 2012-02-02 LAB — OVA AND PARASITE EXAMINATION

## 2012-02-02 MED ORDER — LISINOPRIL-HYDROCHLOROTHIAZIDE 10-12.5 MG PO TABS: 1.0000 | ORAL_TABLET | Freq: Every day | ORAL | Status: DC

## 2012-02-02 MED ORDER — POTASSIUM CHLORIDE CRYS ER 20 MEQ PO TBCR: 40.0000 meq | EXTENDED_RELEASE_TABLET | Freq: Once | ORAL | Status: DC

## 2012-02-02 MED ORDER — CIPROFLOXACIN HCL 500 MG PO TABS: 500.0000 mg | ORAL_TABLET | Freq: Two times a day (BID) | ORAL | Status: AC

## 2012-02-02 MED ORDER — METRONIDAZOLE 500 MG PO TABS: 500.0000 mg | ORAL_TABLET | Freq: Three times a day (TID) | ORAL | Status: AC

## 2012-02-02 NOTE — Discharge Instructions (Signed)
May return to work on Monday June 3rd

## 2012-02-02 NOTE — Discharge Summary (Signed)
Discharge Summary  Dylan Garner MR#: [id number]  DOB:Dec 23, 1965  Date of Admission: 01/30/2012 Date of Discharge: 02/02/2012  Patient's PCP: Sheila Oats, MD, MD  Attending Physician:Lineth Thielke  Consults:   none  Discharge Diagnoses: Enteritis AKI    Brief Admitting History and Physical Pt is 46 yo male who presents to [facility] ED with main concern of progressively worsening generalized lower quadrant, dull abdominal pain, intermittent in nature, radiating to bilateral flank area, 10/10 in severity when present with no specific aggravating or alleviating factors. The episodes have been associated with subjective fevers, chills, nauseam poor oral intake, dysuria and sensation of incomplete voiding. Pt denies similar episodes in the past. He also denies any recent sicknesses of hospitalizations, no sick contacts or exposures, no other systemic symptoms of weight loss or gain. Denies headaches, visual changes   Discharge Medications Medication List  As of 02/02/2012  9:25 AM   TAKE these medications         ciprofloxacin 500 MG tablet   Commonly known as: CIPRO   Take 1 tablet (500 mg total) by mouth 2 (two) times daily.      lisinopril-hydrochlorothiazide 10-12.5 MG per tablet   Commonly known as: PRINZIDE,ZESTORETIC   Take 1 tablet by mouth daily.      metroNIDAZOLE 500 MG tablet   Commonly known as: FLAGYL   Take 1 tablet (500 mg total) by mouth 3 (three) times daily.            Hospital Course: SIRS [systemic inflammatory response syndrome]  -Patient had high fever with mild tachycardia and significant leukocytosis meeting criteria for SIRS on admission.  -Symptoms possibly related to acute enteritis/enterocolitis with symptomatic diarrhea and CT abdomen showing pelvic and pararectal stranding.  -Patient was initiated on IV Rocephin and with concern for pyelonephritis. However this does not have any urinary symptoms and UA is not convincing for a UTI. No CVA  tenderness on exam.  - changed antibiotic coverage to IV ciprofloxacin and Flagyl on 5/27 -His stool for C. difficile is negative, stool culture and stool for ova parasites was no growth  -Ordered blood culture and urine culture negative  Acute kidney injury  Possibly related to dehydration and also on HCTZ-lisinopril for his BP  Fe N.a <1 on admission  renal function slowly improving  Restart BP medications and recheck BMP as outpatient  Hypertension  Resume Blood pressure medication   Leukocytosis -resolved  Day of Discharge BP 145/94  Pulse 68  Temp(Src) 98.4 F (36.9 C) (Oral)  Resp 19  Ht 5\' 10"  (1.778 m)  Wt 96.3 kg (212 lb 4.9 oz)  BMI 30.46 kg/m2  SpO2 95% Asking to go home A+O x3 NAD -c/c/e +BS, soft, NT/ND  Results for orders placed during the hospital encounter of 01/30/12 (from the past 48 hour(s))  CBC     Status: Abnormal   Collection Time   01/31/12  9:50 AM      Component Value Range Comment   WBC 16.9 (*) 4.0 - 10.5 (K/uL)    RBC 5.38  4.22 - 5.81 (MIL/uL)    Hemoglobin 16.0  13.0 - 17.0 (g/dL)    HCT 40.9  81.1 - 91.4 (%)    MCV 86.8  78.0 - 100.0 (fL)    MCH 29.7  26.0 - 34.0 (pg)    MCHC 34.3  30.0 - 36.0 (g/dL)    RDW 78.2  95.6 - 21.3 (%)    Platelets 208  150 - 400 (K/uL)  BASIC METABOLIC PANEL     Status: Abnormal   Collection Time   01/31/12  9:50 AM      Component Value Range Comment   Sodium 135  135 - 145 (mEq/L)    Potassium 3.6  3.5 - 5.1 (mEq/L)    Chloride 96  96 - 112 (mEq/L)    CO2 23  19 - 32 (mEq/L)    Glucose, Bld 92  70 - 99 (mg/dL)    BUN 40 (*) 6 - 23 (mg/dL)    Creatinine, Ser 1.61 (*) 0.50 - 1.35 (mg/dL)    Calcium 8.4  8.4 - 10.5 (mg/dL)    GFR calc non Af Amer 35 (*) >90 (mL/min)    GFR calc Af Amer 41 (*) >90 (mL/min)   URINE CULTURE     Status: Normal   Collection Time   01/31/12 10:32 AM      Component Value Range Comment   Specimen Description URINE, CLEAN CATCH      Special Requests NONE      Culture   Setup Time [id number]      Colony Count NO GROWTH      Culture NO GROWTH      Report Status 02/01/2012 FINAL     SODIUM, URINE, RANDOM     Status: Normal   Collection Time   01/31/12 10:32 AM      Component Value Range Comment   Sodium, Ur 28     CREATININE, URINE, RANDOM     Status: Normal   Collection Time   01/31/12 10:32 AM      Component Value Range Comment   Creatinine, Urine 226.73     URINE RAPID DRUG SCREEN (HOSP PERFORMED)     Status: Abnormal   Collection Time   01/31/12 10:32 AM      Component Value Range Comment   Opiates POSITIVE (*) NONE DETECTED     Cocaine NONE DETECTED  NONE DETECTED     Benzodiazepines NONE DETECTED  NONE DETECTED     Amphetamines NONE DETECTED  NONE DETECTED     Tetrahydrocannabinol NONE DETECTED  NONE DETECTED     Barbiturates NONE DETECTED  NONE DETECTED    CLOSTRIDIUM DIFFICILE BY PCR     Status: Normal   Collection Time   01/31/12 10:33 AM      Component Value Range Comment   C difficile by pcr NEGATIVE  NEGATIVE    STOOL CULTURE     Status: Normal (Preliminary result)   Collection Time   01/31/12 10:33 AM      Component Value Range Comment   Specimen Description STOOL      Special Requests NONE      Culture NO SUSPICIOUS COLONIES, CONTINUING TO HOLD      Report Status PENDING     CULTURE, BLOOD (ROUTINE X 2)     Status: Normal (Preliminary result)   Collection Time   01/31/12 10:40 AM      Component Value Range Comment   Specimen Description BLOOD LEFT ARM      Special Requests BOTTLES DRAWN AEROBIC ONLY 6 CC      Culture  Setup Time [id number]      Culture        Value:        BLOOD CULTURE RECEIVED NO GROWTH TO DATE CULTURE WILL BE HELD FOR 5 DAYS BEFORE ISSUING A FINAL NEGATIVE REPORT   Report Status PENDING     GLUCOSE, CAPILLARY  Status: Abnormal   Collection Time   01/31/12 11:45 AM      Component Value Range Comment   Glucose-Capillary 151 (*) 70 - 99 (mg/dL)   GLUCOSE, CAPILLARY     Status: Abnormal   Collection  Time   01/31/12  4:40 PM      Component Value Range Comment   Glucose-Capillary 102 (*) 70 - 99 (mg/dL)   CBC     Status: Abnormal   Collection Time   02/01/12  4:40 AM      Component Value Range Comment   WBC 14.7 (*) 4.0 - 10.5 (K/uL)    RBC 4.95  4.22 - 5.81 (MIL/uL)    Hemoglobin 14.9  13.0 - 17.0 (g/dL)    HCT 56.2  13.0 - 86.5 (%)    MCV 86.1  78.0 - 100.0 (fL)    MCH 30.1  26.0 - 34.0 (pg)    MCHC 35.0  30.0 - 36.0 (g/dL)    RDW 78.4  69.6 - 29.5 (%)    Platelets 194  150 - 400 (K/uL)   COMPREHENSIVE METABOLIC PANEL     Status: Abnormal   Collection Time   02/01/12  4:40 AM      Component Value Range Comment   Sodium 136  135 - 145 (mEq/L)    Potassium 3.5  3.5 - 5.1 (mEq/L)    Chloride 100  96 - 112 (mEq/L)    CO2 27  19 - 32 (mEq/L)    Glucose, Bld 101 (*) 70 - 99 (mg/dL)    BUN 28 (*) 6 - 23 (mg/dL)    Creatinine, Ser 2.84 (*) 0.50 - 1.35 (mg/dL)    Calcium 8.4  8.4 - 10.5 (mg/dL)    Total Protein 6.7  6.0 - 8.3 (g/dL)    Albumin 2.6 (*) 3.5 - 5.2 (g/dL)    AST 132 (*) 0 - 37 (U/L)    ALT 92 (*) 0 - 53 (U/L)    Alkaline Phosphatase 67  39 - 117 (U/L)    Total Bilirubin 1.8 (*) 0.3 - 1.2 (mg/dL)    GFR calc non Af Amer 43 (*) >90 (mL/min)    GFR calc Af Amer 49 (*) >90 (mL/min)   CBC     Status: Abnormal   Collection Time   02/02/12  6:25 AM      Component Value Range Comment   WBC 12.3 (*) 4.0 - 10.5 (K/uL)    RBC 4.84  4.22 - 5.81 (MIL/uL)    Hemoglobin 14.1  13.0 - 17.0 (g/dL)    HCT 44.0  10.2 - 72.5 (%)    MCV 86.4  78.0 - 100.0 (fL)    MCH 29.1  26.0 - 34.0 (pg)    MCHC 33.7  30.0 - 36.0 (g/dL)    RDW 36.6  44.0 - 34.7 (%)    Platelets 212  150 - 400 (K/uL)   BASIC METABOLIC PANEL     Status: Abnormal   Collection Time   02/02/12  6:25 AM      Component Value Range Comment   Sodium 139  135 - 145 (mEq/L)    Potassium 3.2 (*) 3.5 - 5.1 (mEq/L)    Chloride 104  96 - 112 (mEq/L)    CO2 27  19 - 32 (mEq/L)    Glucose, Bld 89  70 - 99 (mg/dL)    BUN 12   6 - 23 (mg/dL)    Creatinine, Ser 4.25  0.50 - 1.35 (  mg/dL)    Calcium 8.2 (*) 8.4 - 10.5 (mg/dL)    GFR calc non Af Amer 69 (*) >90 (mL/min)    GFR calc Af Amer 80 (*) >90 (mL/min)     Ct Abdomen Pelvis Wo Contrast  01/31/2012  *RADIOLOGY REPORT*  Clinical Data: Decreased appetite.  Chills.  Abdominal pain. Diarrhea.  CT ABDOMEN AND PELVIS WITHOUT CONTRAST  Technique:  Multidetector CT imaging of the abdomen and pelvis was performed following the standard protocol without intravenous contrast.  Comparison: 08/04/2009  Findings: The lung bases are clear.  There is an infiltrative process involving the inferior retroperitoneal fat and extending along the iliac vessels into the pelvis with presacral and perirectal involvement.  There appear to be enlarged perirectal lymph nodes.  This is likely represent inflammatory stranding although retroperitoneal hematoma can also have this appearance. The appendix is normal.  There is no obvious evidence of diverticulitis.  The abdominal aorta is normal in caliber.  No lumbar endplate bone destruction to suggest diskitis. The source of the inflammatory processes is clear. No pyelocaliectasis or ureterectasis.  Changes are new since the previous study.  The surgical absence of the gallbladder.  Enlarged lymph node in the para-esophageal fat at the abdominal inlet measuring 12 mm diameter.  The unenhanced appearance of the liver, spleen, pancreas, adrenal glands, kidneys, abdominal aorta, and retroperitoneal lymph nodes is unremarkable.  The stomach and small bowel are not distended.  No free air or free fluid in the abdomen.  Pelvis:  In addition to the previously described findings, the prostate gland appears mildly enlarged.  The bladder wall is not thickened.  No free or loculated pelvic fluid collections.  Normal alignment of the lumbar vertebra with mild degenerative change.  IMPRESSION: Inferior retroperitoneal, pelvic, and perirectal stranding consistent with  inflammatory process or hematoma.  The source is not identified.  The appendix and sigmoid colon are unremarkable. There is evidence of prominent lymph nodes in the perirectal fat and adjacent to the distal esophagus.  Original Report Authenticated By: Marlon Pel, M.D.     Disposition: home  Diet: cardiac  Activity: as toelrated   Follow-up Appts: Discharge Orders    Future Orders Please Complete By Expires   Diet - low sodium heart healthy      Increase activity slowly      Discharge instructions      Comments:   BMP in 1 week with PCP      TESTS THAT NEED FOLLOW-UP BMP 1 week: re cr and K+  Time spent on discharge, talking to the patient, and coordinating care: 45 mins.   SignedMarlin Canary, DO 02/02/2012, 9:25 AM

## 2012-02-04 LAB — STOOL CULTURE

## 2012-02-06 LAB — CULTURE, BLOOD (ROUTINE X 2)
Culture  Setup Time: 201305272358
Culture  Setup Time: 201305272359
Culture: NO GROWTH

## 2013-07-27 ENCOUNTER — Ambulatory Visit (INDEPENDENT_AMBULATORY_CARE_PROVIDER_SITE_OTHER): Payer: BC Managed Care – PPO | Admitting: Family Medicine

## 2013-07-27 VITALS — BP 154/102 | HR 72 | Temp 98.6°F | Resp 17 | Ht 70.0 in | Wt 215.0 lb

## 2013-07-27 DIAGNOSIS — E663 Overweight: Secondary | ICD-10-CM | POA: Insufficient documentation

## 2013-07-27 DIAGNOSIS — I10 Essential (primary) hypertension: Secondary | ICD-10-CM

## 2013-07-27 DIAGNOSIS — D72829 Elevated white blood cell count, unspecified: Secondary | ICD-10-CM

## 2013-07-27 LAB — LIPID PANEL
HDL: 36 mg/dL — ABNORMAL LOW (ref >39)
LDL Cholesterol: 84 mg/dL (ref 0–99)
Total CHOL/HDL Ratio: 4.2 Ratio
Triglycerides: 149 mg/dL (ref <150)

## 2013-07-27 LAB — COMPREHENSIVE METABOLIC PANEL
Albumin: 4.5 g/dL (ref 3.5–5.2)
Alkaline Phosphatase: 59 U/L (ref 39–117)
BUN: 10 mg/dL (ref 6–23)
Creat: 1.25 mg/dL (ref 0.50–1.35)
Glucose, Bld: 82 mg/dL (ref 70–99)
Potassium: 4.3 mEq/L (ref 3.5–5.3)
Total Bilirubin: 1.1 mg/dL (ref 0.3–1.2)

## 2013-07-27 LAB — POCT CBC
HCT, POC: 54.9 % — AB (ref 43.5–53.7)
Lymph, poc: 2.4 (ref 0.6–3.4)
MCH, POC: 29.2 pg (ref 27–31.2)
MCHC: 31.5 g/dL — AB (ref 31.8–35.4)
MCV: 92.5 fL (ref 80–97)
MID (cbc): 0.3 (ref 0–0.9)
POC LYMPH PERCENT: 55.2 %L — AB (ref 10–50)
RDW, POC: 13.2 %
WBC: 4.4 10*3/uL — AB (ref 4.6–10.2)

## 2013-07-27 MED ORDER — LISINOPRIL-HYDROCHLOROTHIAZIDE 10-12.5 MG PO TABS: 1.0000 | ORAL_TABLET | Freq: Every day | ORAL | Status: DC

## 2013-07-27 NOTE — Patient Instructions (Signed)
I will be in touch with your labs when they come in.  Get back on your BP medication asap.

## 2013-07-27 NOTE — Progress Notes (Signed)
[facility] [address], [place] [address] [phone]  Date:  07/27/2013   Name:  Dylan Garner   DOB:  [birth year]   MRN:  [id number]  PCP:  Default, Provider, MD    Chief Complaint: Medication Refill   History of Present Illness:  Dylan Garner is a 47 y.o. very pleasant male patient who presents with the following:  Here today for a medication refill.  He has been out of his BP medication for about 2 months.   He notes that his pressure is ok when he is taking the medication; maybe 130/80 He is feeling well- no CP, no headache  He cannot remember if he ate this am yet or not.  However he is overdue for labs and would like to do these today No other concerns.   There are no active problems to display for this patient.   Past Medical History  Diagnosis Date  . Hypertension     Past Surgical History  Procedure Laterality Date  . Cholecystectomy      History  Substance Use Topics  . Smoking status: Never Smoker   . Smokeless tobacco: Not on file  . Alcohol Use: Yes    History reviewed. No pertinent family history.  No Known Allergies  Medication list has been reviewed and updated.  Current Outpatient Prescriptions on File Prior to Visit  Medication Sig Dispense Refill  . lisinopril-hydrochlorothiazide (PRINZIDE,ZESTORETIC) 10-12.5 MG per tablet Take 1 tablet by mouth daily.  90 tablet  2   No current facility-administered medications on file prior to visit.    Review of Systems:  As per HPI- otherwise negative.   Physical Examination: Filed Vitals:   07/27/13 1052  BP: 154/102  Pulse: 72  Temp: 98.6 F (37 C)  Resp: 17   Filed Vitals:   07/27/13 1052  Height: 5\' 10"  (1.778 m)  Weight: 215 lb (97.523 kg)   Body mass index is 30.85 kg/(m^2). Ideal Body Weight: Weight in (lb) to have BMI = 25: 173.9  GEN: WDWN, NAD, Non-toxic, A & O x 3, overweight, looks well HEENT: Atraumatic, Normocephalic. Neck supple. No  masses, No LAD. Ears and Nose: No external deformity. CV: RRR, No M/G/R. No JVD. No thrill. No extra heart sounds. PULM: CTA B, no wheezes, crackles, rhonchi. No retractions. No resp. distress. No accessory muscle use. EXTR: No c/c/e NEURO Normal gait.  PSYCH: Normally interactive. Conversant. Not depressed or anxious appearing.  Calm demeanor.   Results for orders placed in visit on 07/27/13  POCT CBC      Result Value Range   WBC 4.4 (*) 4.6 - 10.2 K/uL   Lymph, poc 2.4  0.6 - 3.4   POC LYMPH PERCENT 55.2 (*) 10 - 50 %L   MID (cbc) 0.3  0 - 0.9   POC MID % 7.6  0 - 12 %M   POC Granulocyte 1.6 (*) 2 - 6.9   Granulocyte percent 37.2  37 - 80 %G   RBC 5.93  4.69 - 6.13 M/uL   Hemoglobin 17.3  14.1 - 18.1 g/dL   HCT, POC 29.5 (*) 62.1 - 53.7 %   MCV 92.5  80 - 97 fL   MCH, POC 29.2  27 - 31.2 pg   MCHC 31.5 (*) 31.8 - 35.4 g/dL   RDW, POC 30.8     Platelet Count, POC 267  142 - 424 K/uL   MPV 8.4  0 - 99.8  fL     Assessment and Plan: HTN (hypertension) - Plan: lisinopril-hydrochlorothiazide (PRINZIDE,ZESTORETIC) 10-12.5 MG per tablet, POCT CBC, Comprehensive metabolic panel, Lipid panel  Elevated BP but he has been out of his medicaiton.  Refill med and draw labs today  Restart BP medication Will plan further follow- up pending labs.  Signed Abbe Amsterdam, MD

## 2013-07-29 ENCOUNTER — Encounter: Payer: Self-pay | Admitting: Family Medicine

## 2013-07-29 NOTE — Addendum Note (Signed)
Addended by: Abbe Amsterdam C on: 07/29/2013 11:14 AM   Modules accepted: Orders

## 2014-08-05 ENCOUNTER — Ambulatory Visit (INDEPENDENT_AMBULATORY_CARE_PROVIDER_SITE_OTHER): Payer: BC Managed Care – PPO | Admitting: Emergency Medicine

## 2014-08-05 ENCOUNTER — Other Ambulatory Visit: Payer: Self-pay | Admitting: Family Medicine

## 2014-08-05 VITALS — BP 140/100 | HR 71 | Temp 97.7°F | Resp 16 | Ht 71.0 in | Wt 220.8 lb

## 2014-08-05 DIAGNOSIS — I10 Essential (primary) hypertension: Secondary | ICD-10-CM

## 2014-08-05 LAB — POCT CBC
Granulocyte percent: 40.1 %G (ref 37–80)
HEMATOCRIT: 49.9 % (ref 43.5–53.7)
HEMOGLOBIN: 16.2 g/dL (ref 14.1–18.1)
LYMPH, POC: 2.3 (ref 0.6–3.4)
MCH: 28.7 pg (ref 27–31.2)
MCHC: 32.4 g/dL (ref 31.8–35.4)
MCV: 88.6 fL (ref 80–97)
MID (cbc): 0.2 (ref 0–0.9)
MPV: 7 fL (ref 0–99.8)
POC GRANULOCYTE: 1.7 — AB (ref 2–6.9)
POC LYMPH %: 55.4 % — AB (ref 10–50)
POC MID %: 4.5 % (ref 0–12)
Platelet Count, POC: 257 10*3/uL (ref 142–424)
RBC: 5.64 M/uL (ref 4.69–6.13)
RDW, POC: 13.4 %
WBC: 4.2 10*3/uL — AB (ref 4.6–10.2)

## 2014-08-05 MED ORDER — LISINOPRIL-HYDROCHLOROTHIAZIDE 10-12.5 MG PO TABS: ORAL_TABLET | ORAL | Status: DC

## 2014-08-05 NOTE — Patient Instructions (Signed)

## 2014-08-05 NOTE — Progress Notes (Addendum)
Subjective:  This chart was scribed for Lesle ChrisSteven Karoline Fleer, MD by Carl Bestelina Holson, Medical Scribe. This patient was seen in Room 13 and the patient's care was started at 1:07 PM.   Patient ID: Dylan Garner, male    DOB: 26-Feb-1966, 48 y.o.   MRN: [id number]  HPI  HPI Comments: Dylan Harderlton Gell is a 48 y.o. male who presents to the [facility] complaining of hypertension.  He stopped taking his blood pressure medication 3 weeks ago and did not experience any side effects from the medicine.  He denies headache, chest pain, cough, and SOB as associated symptoms.  He has a long-standing history of hematuria that started when he was a teenager.  He would not like a flu shot today.  Patient Active Problem List   Diagnosis Date Noted  . Essential hypertension, benign 07/27/2013  . Overweight 07/27/2013   Past Medical History  Diagnosis Date  . Hypertension    Past Surgical History  Procedure Laterality Date  . Cholecystectomy     No Known Allergies Prior to Admission medications   Medication Sig Start Date End Date Taking? Authorizing Provider  lisinopril-hydrochlorothiazide (PRINZIDE,ZESTORETIC) 10-12.5 MG per tablet Take 1 tablet by mouth daily. 07/27/13  Yes Pearline CablesJessica C Copland, MD   History   Social History  . Marital Status: Married    Spouse Name: N/A    Number of Children: N/A  . Years of Education: N/A   Occupational History  . Not on file.   Social History Main Topics  . Smoking status: Never Smoker   . Smokeless tobacco: Not on file  . Alcohol Use: Yes  . Drug Use: No  . Sexual Activity: No   Other Topics Concern  . Not on file   Social History Narrative     Review of Systems  Respiratory: Negative for cough and shortness of breath.   Cardiovascular: Negative for chest pain.  Neurological: Negative for headaches.     Objective:  Physical Exam  Constitutional: He is oriented to person, place, and time. He appears well-developed and well-nourished.    HENT:  Head: Normocephalic and atraumatic.  Right Ear: External ear normal.  Left Ear: External ear normal.  Nose: Nose normal.  Mouth/Throat: Oropharynx is clear and moist. No oropharyngeal exudate.  Eyes: Conjunctivae and EOM are normal. Pupils are equal, round, and reactive to light. No scleral icterus.  Neck: Normal range of motion. Neck supple. No thyromegaly present.  Cardiovascular: Normal rate, regular rhythm and normal heart sounds.   No murmur heard. Pulmonary/Chest: Effort normal and breath sounds normal. No respiratory distress. He has no wheezes. He has no rales.  Abdominal: Soft. There is no rebound. A hernia is present.  Small 1 cm umbilical hernia.  Musculoskeletal: Normal range of motion.  Lymphadenopathy:    He has no cervical adenopathy.  Neurological: He is alert and oriented to person, place, and time.  Skin: Skin is warm and dry.  Psychiatric: He has a normal mood and affect. His behavior is normal.  Nursing note and vitals reviewed.    BP 140/100 mmHg  Pulse 71  Temp(Src) 97.7 F (36.5 C) (Oral)  Resp 16  Ht 5\' 11"  (1.803 m)  Wt 220 lb 12.8 oz (100.154 kg)  BMI 30.81 kg/m2  SpO2 98% Assessment & Plan:   I repeated his blood pressure today and it was 138/88. I refilled his blood pressure medications. I encouraged him to continue to lose weight exercise and watch his salt  intake.I personally performed the services described in this documentation, which was scribed in my presence. The recorded information has been reviewed and is accurate.

## 2014-08-06 LAB — LIPID PANEL
CHOL/HDL RATIO: 4.1 ratio
CHOLESTEROL: 158 mg/dL (ref 0–200)
HDL: 39 mg/dL — AB (ref >39)
LDL Cholesterol: 89 mg/dL (ref 0–99)
TRIGLYCERIDES: 148 mg/dL (ref <150)
VLDL: 30 mg/dL (ref 0–40)

## 2014-08-06 LAB — COMPLETE METABOLIC PANEL WITH GFR
ALK PHOS: 43 U/L (ref 39–117)
ALT: 29 U/L (ref 0–53)
AST: 23 U/L (ref 0–37)
Albumin: 4.3 g/dL (ref 3.5–5.2)
BILIRUBIN TOTAL: 0.7 mg/dL (ref 0.2–1.2)
BUN: 12 mg/dL (ref 6–23)
CO2: 24 meq/L (ref 19–32)
CREATININE: 1.01 mg/dL (ref 0.50–1.35)
Calcium: 9 mg/dL (ref 8.4–10.5)
Chloride: 106 mEq/L (ref 96–112)
GFR, EST NON AFRICAN AMERICAN: 88 mL/min
GLUCOSE: 83 mg/dL (ref 70–99)
Potassium: 4.2 mEq/L (ref 3.5–5.3)
SODIUM: 139 meq/L (ref 135–145)
TOTAL PROTEIN: 7 g/dL (ref 6.0–8.3)

## 2018-08-31 ENCOUNTER — Encounter (HOSPITAL_COMMUNITY): Payer: Self-pay

## 2018-08-31 ENCOUNTER — Ambulatory Visit: Payer: BLUE CROSS/BLUE SHIELD

## 2018-08-31 ENCOUNTER — Ambulatory Visit
Admission: EM | Admit: 2018-08-31 | Discharge: 2018-08-31 | Disposition: A | Discharge status: Home / Self Care | Payer: BLUE CROSS/BLUE SHIELD | Source: Home / Self Care

## 2018-08-31 ENCOUNTER — Emergency Department (HOSPITAL_COMMUNITY)
Admission: EM | Admit: 2018-08-31 | Discharge: 2018-09-01 | Disposition: A | Discharge status: Home / Self Care | Payer: BLUE CROSS/BLUE SHIELD | Attending: Emergency Medicine | Admitting: Emergency Medicine

## 2018-08-31 ENCOUNTER — Other Ambulatory Visit: Payer: Self-pay

## 2018-08-31 DIAGNOSIS — R7401 Elevation of levels of liver transaminase levels: Secondary | ICD-10-CM

## 2018-08-31 DIAGNOSIS — R740 Nonspecific elevation of levels of transaminase and lactic acid dehydrogenase [LDH]: Secondary | ICD-10-CM | POA: Insufficient documentation

## 2018-08-31 DIAGNOSIS — R9431 Abnormal electrocardiogram [ECG] [EKG]: Secondary | ICD-10-CM | POA: Insufficient documentation

## 2018-08-31 DIAGNOSIS — I10 Essential (primary) hypertension: Secondary | ICD-10-CM

## 2018-08-31 DIAGNOSIS — R1033 Periumbilical pain: Secondary | ICD-10-CM | POA: Insufficient documentation

## 2018-08-31 DIAGNOSIS — R030 Elevated blood-pressure reading, without diagnosis of hypertension: Secondary | ICD-10-CM

## 2018-08-31 DIAGNOSIS — Z9049 Acquired absence of other specified parts of digestive tract: Secondary | ICD-10-CM

## 2018-08-31 DIAGNOSIS — N289 Disorder of kidney and ureter, unspecified: Secondary | ICD-10-CM | POA: Insufficient documentation

## 2018-08-31 DIAGNOSIS — R1013 Epigastric pain: Secondary | ICD-10-CM | POA: Insufficient documentation

## 2018-08-31 DIAGNOSIS — Z79899 Other long term (current) drug therapy: Secondary | ICD-10-CM | POA: Diagnosis not present

## 2018-08-31 LAB — URINALYSIS, ROUTINE W REFLEX MICROSCOPIC
BACTERIA UA: NONE SEEN
Bilirubin Urine: NEGATIVE
GLUCOSE, UA: NEGATIVE mg/dL
KETONES UR: NEGATIVE mg/dL
LEUKOCYTES UA: NEGATIVE
Nitrite: NEGATIVE
PH: 7 (ref 5.0–8.0)
PROTEIN: NEGATIVE mg/dL
Specific Gravity, Urine: 1.008 (ref 1.005–1.030)

## 2018-08-31 LAB — COMPREHENSIVE METABOLIC PANEL
ALK PHOS: 112 U/L (ref 38–126)
ALT: 463 U/L — AB (ref 0–44)
AST: 242 U/L — ABNORMAL HIGH (ref 15–41)
Albumin: 4 g/dL (ref 3.5–5.0)
Anion gap: 5 (ref 5–15)
BUN: 10 mg/dL (ref 6–20)
CALCIUM: 9 mg/dL (ref 8.9–10.3)
CHLORIDE: 104 mmol/L (ref 98–111)
CO2: 30 mmol/L (ref 22–32)
CREATININE: 1.29 mg/dL — AB (ref 0.61–1.24)
GFR calc non Af Amer: >60 mL/min (ref >60)
GLUCOSE: 94 mg/dL (ref 70–99)
Potassium: 4 mmol/L (ref 3.5–5.1)
SODIUM: 139 mmol/L (ref 135–145)
Total Bilirubin: 1.6 mg/dL — ABNORMAL HIGH (ref 0.3–1.2)
Total Protein: 7.5 g/dL (ref 6.5–8.1)

## 2018-08-31 LAB — CBC
HEMATOCRIT: 52.7 % — AB (ref 39.0–52.0)
Hemoglobin: 16.8 g/dL (ref 13.0–17.0)
MCH: 28.6 pg (ref 26.0–34.0)
MCHC: 31.9 g/dL (ref 30.0–36.0)
MCV: 89.8 fL (ref 80.0–100.0)
NRBC: 0 % (ref 0.0–0.2)
PLATELETS: 289 10*3/uL (ref 150–400)
RBC: 5.87 MIL/uL — ABNORMAL HIGH (ref 4.22–5.81)
RDW: 13.1 % (ref 11.5–15.5)
WBC: 4.2 10*3/uL (ref 4.0–10.5)

## 2018-08-31 LAB — LIPASE, BLOOD: LIPASE: 48 U/L (ref 11–51)

## 2018-08-31 NOTE — ED Provider Notes (Signed)
[facility]   [id number] 08/31/18 Arrival Time: 1728  CC: ABDOMINAL DISCOMFORT  SUBJECTIVE:  Dylan Garner is a 52 y.o. male hx significant for HTN, who presents with complaint of intermittent abdominal discomfort that began abruptly 4-5 days ago.  Denies a precipitating event, trauma, association with food. Localizes pain to periumbilical/ epigastric region.  Describes as stable, intermittent and sharp in character.  Last for a few seconds to hours at a time.  Pain is 4-5/10.  Has tried OTC medications including TUMS and nexium.  Reports worthwhile relief with nexium.  Symptoms improved with laying flat.  Denies aggravating factors.  Reports similar symptoms related to gallstones in the past, but has had his gallbladder removed. Last BM this morning and normal for patient.  Does admit to decreased flatus   Denies fever, chills, appetite changes, weight changes, nausea, vomiting, chest pain, SOB, dizziness, vision changes, lightheadedness, diarrhea, constipation, hematochezia, melena, dysuria, difficulty urinating, increased frequency or urgency, flank pain, loss of bowel or bladder function.  Denies hx of high cholesterol, family hx of cardiac dx, tobacco use.  Does admit to intermittent alcohol use.    No LMP for male patient.  ROS: As per HPI.  Past Medical History:  Diagnosis Date  . Hypertension    Past Surgical History:  Procedure Laterality Date  . CHOLECYSTECTOMY     No Known Allergies No current facility-administered medications on file prior to encounter.    Current Outpatient Medications on File Prior to Encounter  Medication Sig Dispense Refill  . lisinopril-hydrochlorothiazide (PRINZIDE,ZESTORETIC) 10-12.5 MG per tablet TAKE 1 TABLET BY MOUTH ONCE DAILY 30 tablet 11   Social History   Socioeconomic History  . Marital status: Married    Spouse name: Not on file  . Number of children: Not on file  . Years of education: Not on file  . Highest education  level: Not on file  Occupational History  . Not on file  Social Needs  . Financial resource strain: Not on file  . Food insecurity:    Worry: Not on file    Inability: Not on file  . Transportation needs:    Medical: Not on file    Non-medical: Not on file  Tobacco Use  . Smoking status: Never Smoker  . Smokeless tobacco: Never Used  Substance and Sexual Activity  . Alcohol use: Yes  . Drug use: No  . Sexual activity: Never    Birth control/protection: Abstinence  Lifestyle  . Physical activity:    Days per week: Not on file    Minutes per session: Not on file  . Stress: Not on file  Relationships  . Social connections:    Talks on phone: Not on file    Gets together: Not on file    Attends religious service: Not on file    Active member of club or organization: Not on file    Attends meetings of clubs or organizations: Not on file    Relationship status: Not on file  . Intimate partner violence:    Fear of current or ex partner: Not on file    Emotionally abused: Not on file    Physically abused: Not on file    Forced sexual activity: Not on file  Other Topics Concern  . Not on file  Social History Narrative  . Not on file   No family history on file.   OBJECTIVE:  Vitals:   08/31/18 1750  BP: (!) 181/130  Pulse: 86  Resp:  18  Temp: 98.1 F (36.7 C)  TempSrc: Oral  SpO2: 97%    General appearance: Alert; NAD HEENT: NCAT.  Oropharynx clear.  Lungs: clear to auscultation bilaterally without adventitious breath sounds Heart: regular rate and rhythm.  Radial pulses 2+ symmetrical bilaterally Abdomen: soft, non-distended; normal active bowel sounds; mild epigastric and periumbilical tenderness; nontender at McBurney's point; negative rebound; no guarding Extremities: no edema; symmetrical with no gross deformities Skin: warm and dry Neurologic: normal gait Psychological: alert and cooperative; normal mood and affect  DIAGNOSTIC STUDIES: Dg Abd 2  Views  Result Date: 08/31/2018 CLINICAL DATA:  Abdominal discomfort, epigastric pain EXAM: ABDOMEN - 2 VIEW COMPARISON:  08/17/2008 FINDINGS: Prior cholecystectomy. Moderate stool burden in the colon. There is normal bowel gas pattern. No free air. No organomegaly or suspicious calcification. No acute bony abnormality. IMPRESSION: Moderate stool burden.  No acute findings. Electronically Signed   By: Charlett NoseKevin  Dover M.D.   On: 08/31/2018 18:54   EKG:  EKG normal sinus rhythm with RBBB.  No previous EKG on file for comparison.  Reviewed EKG with Dr. Dayton ScrapeMurray.      MDM:  Discussed patient case with Dr. Dayton ScrapeMurray.   Symptoms of intermittent epigastric pain x 4-5 days.  See ROS above.  PE positive for mild epigastric and periumbilical tenderness.  Hypertensive on exam (180/130), but NAD.  EKG with new RBBB, no previous EKG to compare.  Abd x-rays with moderate stool burden.  Pt aware and in agreement with this plan.  [facility] and management.    ASSESSMENT & PLAN:  1. Abnormal EKG   2. Epigastric discomfort     Recommending further evaluation and management of possible new RT bundle branch block and epigastric discomfort in the ED.  Patient aware and in agreement with this plan.      Rennis HardingWurst, Shayne Diguglielmo, PA-C 08/31/18 1913

## 2018-08-31 NOTE — Discharge Instructions (Addendum)
Recommending further evaluation and management of possible new RT bundle branch block and epigastric discomfort in the ED.  Patient aware and in agreement with this plan.

## 2018-08-31 NOTE — ED Triage Notes (Signed)
Pt here for abdominal pain up into the epigastric area.  Pt seen at urgent care and set here after EKG showed RBBB.  A&Ox4 no SOB or CP. Pain in abdomen goes away when he lays flat.

## 2018-08-31 NOTE — ED Triage Notes (Signed)
Pt c/o center to upper abdominal pain off and on over the past 4 days, states feels like trapped gas. States pain goes away when lying down; states hasn't took his b/p meds in a 5-6 days

## 2018-09-01 ENCOUNTER — Emergency Department (HOSPITAL_COMMUNITY): Payer: BLUE CROSS/BLUE SHIELD

## 2018-09-01 MED ORDER — FAMOTIDINE 20 MG PO TABS: 20.0000 mg | ORAL_TABLET | Freq: Two times a day (BID) | ORAL | 0 refills | Status: DC

## 2018-09-01 MED ORDER — SUCRALFATE 1 G PO TABS: 1.0000 g | ORAL_TABLET | Freq: Three times a day (TID) | ORAL | 0 refills | Status: DC

## 2018-09-01 MED ORDER — ALUM & MAG HYDROXIDE-SIMETH 200-200-20 MG/5ML PO SUSP
30.0000 mL | Freq: Once | ORAL | Status: AC
  Administered 2018-09-01: 30 mL via ORAL
  Filled 2018-09-01: qty 30

## 2018-09-01 MED ORDER — DIPHENHYDRAMINE HCL 50 MG/ML IJ SOLN
25.0000 mg | Freq: Once | INTRAMUSCULAR | Status: AC
  Administered 2018-09-01: 25 mg via INTRAVENOUS
  Filled 2018-09-01: qty 1

## 2018-09-01 MED ORDER — IOHEXOL 300 MG/ML  SOLN
100.0000 mL | Freq: Once | INTRAMUSCULAR | Status: AC | PRN
  Administered 2018-09-01: 100 mL via INTRAVENOUS

## 2018-09-01 MED ORDER — MORPHINE SULFATE (PF) 4 MG/ML IV SOLN
4.0000 mg | Freq: Once | INTRAVENOUS | Status: AC
  Administered 2018-09-01: 4 mg via INTRAVENOUS
  Filled 2018-09-01: qty 1

## 2018-09-01 MED ORDER — METOCLOPRAMIDE HCL 5 MG/ML IJ SOLN
10.0000 mg | Freq: Once | INTRAMUSCULAR | Status: AC
  Administered 2018-09-01: 10 mg via INTRAVENOUS
  Filled 2018-09-01: qty 2

## 2018-09-01 MED ORDER — LIDOCAINE VISCOUS HCL 2 % MT SOLN
15.0000 mL | Freq: Once | OROMUCOSAL | Status: AC
  Administered 2018-09-01: 15 mL via ORAL
  Filled 2018-09-01: qty 15

## 2018-09-01 MED ORDER — ONDANSETRON HCL 4 MG/2ML IJ SOLN
4.0000 mg | Freq: Once | INTRAMUSCULAR | Status: AC
  Administered 2018-09-01: 4 mg via INTRAVENOUS
  Filled 2018-09-01: qty 2

## 2018-09-01 NOTE — ED Notes (Signed)
Patient verbalizes understanding of discharge instructions. Opportunity for questioning and answers were provided. Armband removed by staff, pt discharged from ED ambulatory.   

## 2018-09-01 NOTE — ED Provider Notes (Signed)
[facility] Provider Note   CSN: [id number] Arrival date & time: 08/31/18  1922     History   Chief Complaint Chief Complaint  Patient presents with  . Abdominal Pain    HPI Dylan Garner is a 52 y.o. male.  HPI   52 year old male with history of hypertension here with intermittent abdominal discomfort.  The patient states his symptoms began 4 to 5 days ago.  His symptoms began fairly acutely with a severe, stabbing, epigastric and periumbilical pull pain.  Since then, this has returned once as severe, but now has developed into a more intermittent epigastric discomfort sensation.  He said associated nausea but no vomiting.  No diarrhea.  Pain is currently 3-4 out of 10.  Symptoms do seem to improve when changing positions.  He also states that symptoms improve when sitting upright.  However, when lying completely flat, the symptoms do mildly worsened.  He has a history of gallstones and states he had similar symptoms, though he has not had nausea that was associated with his gallstones at that time.  No fevers or chills.  No chest pain.  He does admit to intermittent and recently increased alcohol use.  No other complaints.  No alleviating factors.  Past Medical History:  Diagnosis Date  . Hypertension     Patient Active Problem List   Diagnosis Date Noted  . Essential hypertension, benign 07/27/2013  . Overweight 07/27/2013    Past Surgical History:  Procedure Laterality Date  . CHOLECYSTECTOMY          Home Medications    Prior to Admission medications   Medication Sig Start Date End Date Taking? Authorizing Provider  lisinopril-hydrochlorothiazide (PRINZIDE,ZESTORETIC) 10-12.5 MG per tablet TAKE 1 TABLET BY MOUTH ONCE DAILY Patient taking differently: Take 1 tablet by mouth daily.  08/05/14  Yes Collene Gobbleaub, Steven A, MD  famotidine (PEPCID) 20 MG tablet Take 1 tablet (20 mg total) by mouth 2 (two) times daily for 5 days. 09/01/18  09/06/18  Shaune PollackIsaacs, Markis Langland, MD  sucralfate (CARAFATE) 1 g tablet Take 1 tablet (1 g total) by mouth 4 (four) times daily -  with meals and at bedtime for 5 days. 09/01/18 09/06/18  Shaune PollackIsaacs, Lindsee Labarre, MD    Family History History reviewed. No pertinent family history.  Social History Social History   Tobacco Use  . Smoking status: Never Smoker  . Smokeless tobacco: Never Used  Substance Use Topics  . Alcohol use: Yes  . Drug use: No     Allergies   Patient has no known allergies.   Review of Systems Review of Systems  Constitutional: Positive for fatigue. Negative for chills and fever.  HENT: Negative for congestion and rhinorrhea.   Eyes: Negative for visual disturbance.  Respiratory: Negative for cough, shortness of breath and wheezing.   Cardiovascular: Negative for chest pain and leg swelling.  Gastrointestinal: Positive for abdominal pain. Negative for diarrhea, nausea and vomiting.  Genitourinary: Negative for dysuria and flank pain.  Musculoskeletal: Negative for neck pain and neck stiffness.  Skin: Negative for rash and wound.  Allergic/Immunologic: Negative for immunocompromised state.  Neurological: Negative for syncope, weakness and headaches.  All other systems reviewed and are negative.    Physical Exam Updated Vital Signs BP 138/78 (BP Location: Right Arm)   Pulse 72   Temp 98.3 F (36.8 C) (Oral)   Resp 16   Ht 5\' 9"  (1.753 m)   Wt 90.7 kg   SpO2 100%  BMI 29.53 kg/m   Physical Exam Vitals signs and nursing note reviewed.  Constitutional:      General: He is not in acute distress.    Appearance: He is well-developed.  HENT:     Head: Normocephalic and atraumatic.  Eyes:     Conjunctiva/sclera: Conjunctivae normal.  Neck:     Musculoskeletal: Neck supple.  Cardiovascular:     Rate and Rhythm: Normal rate and regular rhythm.     Heart sounds: Normal heart sounds. No murmur. No friction rub.  Pulmonary:     Effort: Pulmonary effort is normal.  No respiratory distress.     Breath sounds: Normal breath sounds. No wheezing or rales.  Abdominal:     General: There is no distension.     Palpations: Abdomen is soft.     Tenderness: There is abdominal tenderness in the epigastric area and periumbilical area. There is no right CVA tenderness, left CVA tenderness, guarding or rebound. Negative signs include Murphy's sign.  Skin:    General: Skin is warm.     Capillary Refill: Capillary refill takes less than 2 seconds.  Neurological:     Mental Status: He is alert and oriented to person, place, and time.     Motor: No abnormal muscle tone.      ED Treatments / Results  Labs (all labs ordered are listed, but only abnormal results are displayed) Labs Reviewed  COMPREHENSIVE METABOLIC PANEL - Abnormal; Notable for the following components:      Result Value   Creatinine, Ser 1.29 (*)    AST 242 (*)    ALT 463 (*)    Total Bilirubin 1.6 (*)    All other components within normal limits  CBC - Abnormal; Notable for the following components:   RBC 5.87 (*)    HCT 52.7 (*)    All other components within normal limits  URINALYSIS, ROUTINE W REFLEX MICROSCOPIC - Abnormal; Notable for the following components:   Hgb urine dipstick MODERATE (*)    All other components within normal limits  LIPASE, BLOOD    EKG None   NSR, VR 68. QRS 102, QTc 427. RBBB, no significant change from prior. No acute ischemia or infarct.  Radiology Ct Abdomen Pelvis W Contrast  Result Date: 09/01/2018 CLINICAL DATA:  Acute onset of epigastric abdominal pain. EXAM: CT ABDOMEN AND PELVIS WITH CONTRAST TECHNIQUE: Multidetector CT imaging of the abdomen and pelvis was performed using the standard protocol following bolus administration of intravenous contrast. CONTRAST:  OMNIPAQUE IOHEXOL 300 MG/ML  SOLN COMPARISON:  CT of the abdomen and pelvis 01/31/2012 FINDINGS: Lower chest: The distal esophagus is filled with fluid, which may reflect mild  dysmotility or gastroesophageal reflux. The visualized lung bases are clear. The visualized portions of the mediastinum are unremarkable. Hepatobiliary: The liver is unremarkable in appearance. The patient is status post cholecystectomy, with clips noted at the gallbladder fossa. The common bile duct remains normal in caliber. Pancreas: The pancreas is within normal limits. Spleen: The spleen is unremarkable in appearance. Adrenals/Urinary Tract: The adrenal glands are unremarkable in appearance. There is a 2.3 cm isodense mass at the medial aspect of the left kidney, which appears to demonstrate mildly increased enhancement on delayed images. This is new from 2013. Malignancy cannot be excluded. The kidneys are otherwise unremarkable. There is no evidence of hydronephrosis. No renal or ureteral stones are identified. Minimal nonspecific perinephric stranding is noted bilaterally. Stomach/Bowel: The stomach is unremarkable in appearance. The small bowel  is within normal limits. The appendix is normal in caliber, without evidence of appendicitis. The colon is unremarkable in appearance. Vascular/Lymphatic: The abdominal aorta is unremarkable in appearance. The inferior vena cava is grossly unremarkable. No retroperitoneal lymphadenopathy is seen. No pelvic sidewall lymphadenopathy is identified. Reproductive: The bladder is moderately distended and grossly unremarkable. The prostate is enlarged, measuring 5.8 cm in transverse dimension. The patient is status post cystectomy. Other: No additional soft tissue abnormalities are seen. Musculoskeletal: No acute osseous abnormalities are identified. The visualized musculature is unremarkable in appearance. IMPRESSION: 1. No acute abnormality seen to explain the patient's symptoms. 2. 2.3 cm isodense mass at the medial aspect of the left kidney, which appears to demonstrate mildly increased enhancement on delayed images. Malignancy cannot be excluded. Renal protocol MRI or  CT is recommended for further evaluation, when and as deemed clinically appropriate. 3. Distal esophagus filled with fluid, which may reflect mild dysmotility or gastroesophageal reflux. 4. Enlarged prostate noted. Electronically Signed   By: Roanna RaiderJeffery  Chang M.D.   On: 09/01/2018 01:54   Dg Abd 2 Views  Result Date: 08/31/2018 CLINICAL DATA:  Abdominal discomfort, epigastric pain EXAM: ABDOMEN - 2 VIEW COMPARISON:  08/17/2008 FINDINGS: Prior cholecystectomy. Moderate stool burden in the colon. There is normal bowel gas pattern. No free air. No organomegaly or suspicious calcification. No acute bony abnormality. IMPRESSION: Moderate stool burden.  No acute findings. Electronically Signed   By: Charlett NoseKevin  Dover M.D.   On: 08/31/2018 18:54    Procedures Procedures (including critical care time)  Medications Ordered in ED Medications  morphine 4 MG/ML injection 4 mg (4 mg Intravenous Given 09/01/18 0111)  ondansetron (ZOFRAN) injection 4 mg (4 mg Intravenous Given 09/01/18 0111)  iohexol (OMNIPAQUE) 300 MG/ML solution 100 mL (100 mLs Intravenous Contrast Given 09/01/18 0123)  metoCLOPramide (REGLAN) injection 10 mg (10 mg Intravenous Given 09/01/18 0300)  diphenhydrAMINE (BENADRYL) injection 25 mg (25 mg Intravenous Given 09/01/18 0300)  alum & mag hydroxide-simeth (MAALOX/MYLANTA) 200-200-20 MG/5ML suspension 30 mL (30 mLs Oral Given 09/01/18 0259)    And  lidocaine (XYLOCAINE) 2 % viscous mouth solution 15 mL (15 mLs Oral Given 09/01/18 0259)     Initial Impression / Assessment and Plan / ED Course  I have reviewed the triage vital signs and the nursing notes.  Pertinent labs & imaging results that were available during my care of the patient were reviewed by me and considered in my medical decision making (see chart for details).     52 year old male here with intermittent epigastric discomfort. Labs reviewed. Pt noted to have transaminitis with mildly elevated bilirubin - h/o recent  increased EtOH abuse which I suspect is contributing. AlkP normal. However, given his complaints, CT scan obtained. EKG non-ischemic, no sx of ACS.  CT scan reviewed - he has signs of GERD/gatric dysmotility and his sx are totally resolved w/ reglan and antacids. Suspect he may have a mild alcoholic/reflux esophagitis and gastritis 2/2 recent alcohol use. No biliary duct dilatation or signs of obstruction or retained stone.  Following IVF and tx, pt sx are completely resolved. Discussed his labs, CT imaging, as well as incidental note of possible renal mass/cancer on CT. Given that his sx are resolved, he would like to attempt outpt managemetn with follow-up which I think is reasonable. Antacids Rx.  Final Clinical Impressions(s) / ED Diagnoses   Final diagnoses:  Epigastric abdominal pain  Transaminitis  Lesion of left native kidney    ED Discharge Orders  Ordered    sucralfate (CARAFATE) 1 g tablet  3 times daily with meals & bedtime     09/01/18 0452    famotidine (PEPCID) 20 MG tablet  2 times daily     09/01/18 0452           Shaune Pollack, MD 09/01/18 [id number]

## 2018-09-01 NOTE — Discharge Instructions (Signed)
For your abdominal pain:  Drink plenty of fluids Take the medications as prescribed  As we discussed, your liver enzymes are very elevated. For this, you should have repeat lab work with your doctor in 3-5 days.  DO NOT TAKE ANY TYLENOL  AVOID ALL ALCOHOL  Your CT scan also showed a possible kidney lesion/mass. It's important to follow-up with a doctor in the next 1-2 weeks for this for further imaging and characterization,a s this could represent an early cancer.

## 2018-09-01 NOTE — ED Notes (Signed)
Patient transported to CT 

## 2020-07-07 ENCOUNTER — Ambulatory Visit
Admission: EM | Admit: 2020-07-07 | Discharge: 2020-07-07 | Disposition: A | Discharge status: Home / Self Care | Payer: BC Managed Care – PPO | Attending: Emergency Medicine | Admitting: Emergency Medicine

## 2020-07-07 ENCOUNTER — Other Ambulatory Visit: Payer: Self-pay

## 2020-07-07 DIAGNOSIS — I160 Hypertensive urgency: Secondary | ICD-10-CM | POA: Diagnosis not present

## 2020-07-07 MED ORDER — AMLODIPINE BESYLATE 5 MG PO TABS: 5.0000 mg | ORAL_TABLET | Freq: Every day | ORAL | 0 refills | Status: DC

## 2020-07-07 NOTE — ED Provider Notes (Signed)
[facility]    CSN: [id number] Arrival date & time: 07/07/20  1638      History   Chief Complaint Chief Complaint  Patient presents with  . Hypertension    took bp a couple weeks ago    HPI Dylan Garner is a 54 y.o. male  Presenting for blood pressure check.  States he checked his blood pressure last night while intoxicated and noted that it was high.  States "I feel fine ", though this morning if he should be back on blood pressure medication.  He is to be followed by primary care for this, though has not seen them in a few years.  States that he was able to get blood pressure down the past with diet and weight loss.  Past Medical History:  Diagnosis Date  . Hypertension     Patient Active Problem List   Diagnosis Date Noted  . Essential hypertension, benign 07/27/2013  . Overweight 07/27/2013    Past Surgical History:  Procedure Laterality Date  . CHOLECYSTECTOMY         Home Medications    Prior to Admission medications   Medication Sig Start Date End Date Taking? Authorizing Provider  amLODipine (NORVASC) 5 MG tablet Take 1 tablet (5 mg total) by mouth daily. 07/07/20   Hall-Potvin, [place], PA-C  lisinopril-hydrochlorothiazide (PRINZIDE,ZESTORETIC) 10-12.5 MG per tablet TAKE 1 TABLET BY MOUTH ONCE DAILY Patient taking differently: Take 1 tablet by mouth daily.  08/05/14   Collene Gobble, MD  famotidine (PEPCID) 20 MG tablet Take 1 tablet (20 mg total) by mouth 2 (two) times daily for 5 days. 09/01/18 07/07/20  Shaune Pollack, MD  sucralfate (CARAFATE) 1 g tablet Take 1 tablet (1 g total) by mouth 4 (four) times daily -  with meals and at bedtime for 5 days. 09/01/18 07/07/20  Shaune Pollack, MD    Family History History reviewed. No pertinent family history.  Social History Social History   Tobacco Use  . Smoking status: Never Smoker  . Smokeless tobacco: Never Used  Vaping Use  . Vaping Use: Never used  Substance Use Topics  . Alcohol  use: Yes  . Drug use: No     Allergies   Patient has no known allergies.   Review of Systems Review of Systems  Constitutional: Negative for fatigue and fever.  Respiratory: Negative for cough and shortness of breath.   Cardiovascular: Negative for chest pain and palpitations.  Gastrointestinal: Negative for abdominal pain, diarrhea and vomiting.  Musculoskeletal: Negative for arthralgias and myalgias.  Skin: Negative for rash and wound.  Neurological: Negative for speech difficulty and headaches.  All other systems reviewed and are negative.    Physical Exam Triage Vital Signs ED Triage Vitals  Enc Vitals Group     BP 07/07/20 1714 (!) 195/106     Pulse Rate 07/07/20 1714 87     Resp 07/07/20 1714 17     Temp 07/07/20 1714 98.2 F (36.8 C)     Temp Source 07/07/20 1714 Oral     SpO2 07/07/20 1714 97 %     Weight --      Height --      Head Circumference --      Peak Flow --      Pain Score 07/07/20 1715 0     Pain Loc --      Pain Edu? --      Excl. in GC? --    No data found.  Updated Vital Signs BP (!) 195/106 (BP Location: Left Arm)   Pulse 87   Temp 98.2 F (36.8 C) (Oral)   Resp 17   SpO2 97%   Visual Acuity Right Eye Distance:   Left Eye Distance:   Bilateral Distance:    Right Eye Near:   Left Eye Near:    Bilateral Near:     Physical Exam Constitutional:      General: He is not in acute distress.    Appearance: He is not ill-appearing.  HENT:     Head: Normocephalic and atraumatic.  Eyes:     General: No scleral icterus.    Pupils: Pupils are equal, round, and reactive to light.  Neck:     Comments: Trachea midline, negative JVD. Cardiovascular:     Rate and Rhythm: Normal rate and regular rhythm.     Pulses: Normal pulses.     Heart sounds: Normal heart sounds.  Pulmonary:     Effort: Pulmonary effort is normal. No respiratory distress.     Breath sounds: No wheezing or rales.  Abdominal:     Palpations: Abdomen is soft.      Tenderness: There is no abdominal tenderness.  Musculoskeletal:     Right lower leg: No edema.     Left lower leg: No edema.  Skin:    Coloration: Skin is not jaundiced or pale.  Neurological:     General: No focal deficit present.     Mental Status: He is alert and oriented to person, place, and time.      UC Treatments / Results  Labs (all labs ordered are listed, but only abnormal results are displayed) Labs Reviewed - No data to display  EKG   Radiology No results found.  Procedures Procedures (including critical care time)  Medications Ordered in UC Medications - No data to display  Initial Impression / Assessment and Plan / UC Course  I have reviewed the triage vital signs and the nursing notes.  Pertinent labs & imaging results that were available during my care of the patient were reviewed by me and considered in my medical decision making (see chart for details).     Patient is hypertensive, though denying signs/symptoms of endorgan damage as mentioned in HPI/ROS.  Exam reassuring at this time.  Will try amlodipine, follow-up with PCP in 2 weeks for further evaluation and management thereof.  Provided dietary/lifestyle modification education packet.  Return precautions discussed, pt verbalized understanding and is agreeable to plan. Final Clinical Impressions(s) / UC Diagnoses   Final diagnoses:  Hypertensive urgency     Discharge Instructions     Important to keep a log of your blood pressure readings. Sit for 5 minutes with feet flat on the floor.  Record numbers once daily, and follow-up with PCP for further evaluation if needed. Go to the ER if you develop elevated blood pressure readings (above 180/110) with severe headache, change in vision or hearing, dizziness, chest pain, shortness of breath.    ED Prescriptions    Medication Sig Dispense Auth. Provider   amLODipine (NORVASC) 5 MG tablet Take 1 tablet (5 mg total) by mouth daily. 60 tablet  Hall-Potvin, [place], PA-C     PDMP not reviewed this encounter.   Hall-Potvin, [place], [place] 07/07/20 1827

## 2020-07-07 NOTE — Discharge Instructions (Addendum)
Important to keep a log of your blood pressure readings. Sit for 5 minutes with feet flat on the floor.  Record numbers once daily, and follow-up with PCP for further evaluation if needed. Go to the ER if you develop elevated blood pressure readings (above 180/110) with severe headache, change in vision or hearing, dizziness, chest pain, shortness of breath. 

## 2020-07-07 NOTE — ED Triage Notes (Signed)
Pt states he wants to keep up on his bp and lost weight. Pt states he was on bp meds awhile ago. Pt has not taken his bp for several weeks and last measurement was approx 140/96. Pt is wanting a prescription if it is high. Explained the need for monitoring and a pcm to the patient. Pt is aox4 and ambulatory.

## 2020-07-14 ENCOUNTER — Ambulatory Visit: Payer: BC Managed Care – PPO | Admitting: Medical

## 2020-07-14 ENCOUNTER — Other Ambulatory Visit: Payer: Self-pay

## 2020-07-14 ENCOUNTER — Encounter: Payer: Self-pay | Admitting: Medical

## 2020-07-14 VITALS — BP 158/102 | HR 80 | Temp 98.7°F | Wt 212.2 lb

## 2020-07-14 DIAGNOSIS — Z720 Tobacco use: Secondary | ICD-10-CM | POA: Diagnosis not present

## 2020-07-14 DIAGNOSIS — Z6831 Body mass index (BMI) 31.0-31.9, adult: Secondary | ICD-10-CM

## 2020-07-14 DIAGNOSIS — R3129 Other microscopic hematuria: Secondary | ICD-10-CM | POA: Diagnosis not present

## 2020-07-14 DIAGNOSIS — I10 Essential (primary) hypertension: Secondary | ICD-10-CM

## 2020-07-14 DIAGNOSIS — R935 Abnormal findings on diagnostic imaging of other abdominal regions, including retroperitoneum: Secondary | ICD-10-CM | POA: Insufficient documentation

## 2020-07-14 DIAGNOSIS — N400 Enlarged prostate without lower urinary tract symptoms: Secondary | ICD-10-CM

## 2020-07-14 LAB — POCT URINALYSIS DIP (PROADVANTAGE DEVICE)
Bilirubin, UA: NEGATIVE
Glucose, UA: NEGATIVE mg/dL
Ketones, POC UA: NEGATIVE mg/dL
Leukocytes, UA: NEGATIVE
Nitrite, UA: NEGATIVE
Specific Gravity, Urine: 1.015
Urobilinogen, Ur: 0.2
pH, UA: 7.5 (ref 5.0–8.0)

## 2020-07-14 MED ORDER — LISINOPRIL-HYDROCHLOROTHIAZIDE 10-12.5 MG PO TABS: 1.0000 | ORAL_TABLET | Freq: Every day | ORAL | 1 refills | Status: DC

## 2020-07-14 NOTE — Progress Notes (Signed)
Referral sent 

## 2020-07-14 NOTE — Progress Notes (Signed)
Subjective:  Dylan Garner is a 54 y.o. male who presents for Chief Complaint  Patient presents with  . other    new pt. est. and BP issues 160/102 other day use to take lisinopril not on bP meds now     Here as a new patient today.  Was going to urgent care prior.  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   Last BP medication over a year ago, was on Lisinopril HCT which worked.   He had no adverse effects with the medicine.  He notes first getting diagnosed with blood pressure 12 years ago.  He has had EKG before but no stress test.  He denies any symptoms.  No chest pain or shortness of breath no edema.  At 1 point they were going to put him on amlodipine but someone said it would make him sleepy so he declined.  He notes a history of microscopic hematuria going back to age 76 years old.  He has had kidney stones in the past.  No recent kidney stones.  No current blood in his urine.  Otherwise normal state of health without complaint.  No other aggravating or relieving factors.    No other c/o.  The following portions of the patient's history were reviewed and updated as appropriate: allergies, current medications, past family history, past medical history, past social history, past surgical history and problem list.  ROS Otherwise as in subjective above  Objective: BP (!) 158/102   Pulse 80   Temp 98.7 F (37.1 C)   Wt 212 lb 3.2 oz (96.3 kg)   BMI 31.34 kg/m   General appearance: alert, no distress, well developed, well nourished Neck: supple, no lymphadenopathy, no thyromegaly, no masses, no bruits Heart: RRR, normal S1, S2, no murmurs Lungs: CTA bilaterally, no wheezes, rhonchi, or rales Abdomen: +bs, soft, non tender, non distended, no masses, no hepatomegaly, no splenomegaly Pulses: 2+ radial pulses, 2+ pedal pulses, normal cap refill Ext: no edema   Assessment: Encounter Diagnoses  Name Primary?  . Essential hypertension, benign Yes  . Microscopic  hematuria   . BMI 31.0-31.9,adult   . Chewing tobacco use   . Abnormal CT of the abdomen   . Enlarged prostate      Plan: Hypertension - Discussed diagnosis, possible complications, treatment recommendations including regular aerobic exercise, low salt diet, dietary sodium restriction, working towards a health weight, and need for medications.  Begin medication Lisinopril HCT as below.  Discussed risks/benefits of medication.    Microscopic hematuria - urine and microscopic reviewed.   He will return in 3-4 weeks for physical, fasting labs, recheck, and likely referral for CT abdomen or referral to urology  BMI > 30 - counseled on diet, exercise, need to lose weight  advised against chewing tobacco use  Hx/o abnormal CT abdomen 2019.  Will likely pursue repeat  CT.  Enlarged prostate based on CT 2019, without symptoms  Dylan Garner was seen today for other.  Diagnoses and all orders for this visit:  Essential hypertension, benign  Microscopic hematuria -     POCT Urinalysis DIP (Proadvantage Device) -     Urine Culture -     CT Abdomen Pelvis W Contrast; Future  BMI 31.0-31.9,adult  Chewing tobacco use  Abnormal CT of the abdomen -     CT Abdomen Pelvis W Contrast; Future  Enlarged prostate  Other orders -     lisinopril-hydrochlorothiazide (ZESTORETIC) 10-12.5 MG tablet; Take 1 tablet by mouth  daily.    Follow up: 3-4 weeks

## 2020-07-16 LAB — URINE CULTURE: Organism ID, Bacteria: NO GROWTH

## 2020-08-04 ENCOUNTER — Ambulatory Visit
Admission: RE | Admit: 2020-08-04 | Discharge: 2020-08-04 | Disposition: A | Discharge status: Home / Self Care | Payer: BC Managed Care – PPO | Source: Ambulatory Visit | Attending: Medical | Admitting: Medical

## 2020-08-04 ENCOUNTER — Telehealth: Payer: Self-pay | Admitting: Medical

## 2020-08-04 ENCOUNTER — Other Ambulatory Visit: Payer: Self-pay | Admitting: Medical

## 2020-08-04 DIAGNOSIS — R935 Abnormal findings on diagnostic imaging of other abdominal regions, including retroperitoneum: Secondary | ICD-10-CM

## 2020-08-04 DIAGNOSIS — N2889 Other specified disorders of kidney and ureter: Secondary | ICD-10-CM

## 2020-08-04 DIAGNOSIS — R3129 Other microscopic hematuria: Secondary | ICD-10-CM

## 2020-08-04 MED ORDER — IOPAMIDOL (ISOVUE-300) INJECTION 61%
100.0000 mL | Freq: Once | INTRAVENOUS | Status: AC | PRN
  Administered 2020-08-04: 100 mL via INTRAVENOUS

## 2020-08-04 NOTE — Telephone Encounter (Signed)
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  He needs follow up about a month after first visit here to recheck on BP.  Hold form until that visit

## 2020-08-05 NOTE — Telephone Encounter (Signed)
Holding form in folder (shane's to do red folder)

## 2020-08-05 NOTE — Telephone Encounter (Signed)
Lmom for patient to call back and schedule 1 month appointment

## 2020-08-06 ENCOUNTER — Other Ambulatory Visit: Payer: Self-pay | Admitting: Medical

## 2020-08-18 ENCOUNTER — Other Ambulatory Visit: Payer: Self-pay

## 2020-08-18 ENCOUNTER — Encounter: Payer: Self-pay | Admitting: Medical

## 2020-08-18 ENCOUNTER — Ambulatory Visit: Payer: BC Managed Care – PPO | Admitting: Medical

## 2020-08-18 VITALS — BP 138/82 | HR 76 | Ht 71.0 in | Wt 211.0 lb

## 2020-08-18 DIAGNOSIS — Z136 Encounter for screening for cardiovascular disorders: Secondary | ICD-10-CM | POA: Diagnosis not present

## 2020-08-18 DIAGNOSIS — I10 Essential (primary) hypertension: Secondary | ICD-10-CM | POA: Diagnosis not present

## 2020-08-18 DIAGNOSIS — Z1211 Encounter for screening for malignant neoplasm of colon: Secondary | ICD-10-CM

## 2020-08-18 DIAGNOSIS — Z125 Encounter for screening for malignant neoplasm of prostate: Secondary | ICD-10-CM | POA: Diagnosis not present

## 2020-08-18 DIAGNOSIS — Z7185 Encounter for immunization safety counseling: Secondary | ICD-10-CM

## 2020-08-18 DIAGNOSIS — Z23 Encounter for immunization: Secondary | ICD-10-CM

## 2020-08-18 DIAGNOSIS — Z0000 Encounter for general adult medical examination without abnormal findings: Secondary | ICD-10-CM

## 2020-08-18 DIAGNOSIS — Z1159 Encounter for screening for other viral diseases: Secondary | ICD-10-CM

## 2020-08-18 NOTE — Progress Notes (Signed)
Subjective:   HPI  Dylan Garner is a 54 y.o. male who presents for Chief Complaint  Patient presents with  . Annual Exam    Physical without fasting-need colonoscopy and tetanus shop     Patient Care Team: Desi Rowe, Kermit Balo, PA-C as PCP - General (Family Medicine) Sees dentist Sees eye doctor  Concerns: HTN - compliant with medication  Reviewed their medical, surgical, family, social, medication, and allergy history and updated chart as appropriate.  Past Medical History:  Diagnosis Date  . Hypertension     Past Surgical History:  Procedure Laterality Date  . CHOLECYSTECTOMY      History reviewed. No pertinent family history.   Current Outpatient Medications:  .  lisinopril-hydrochlorothiazide (ZESTORETIC) 10-12.5 MG tablet, TAKE 1 TABLET BY MOUTH EVERY DAY, Disp: 30 tablet, Rfl: 1  No Known Allergies   Review of Systems Constitutional: -fever, -chills, -sweats, -unexpected weight change, -decreased appetite, -fatigue Allergy: -sneezing, -itching, -congestion Dermatology: -changing moles, --rash, -lumps ENT: -runny nose, -ear pain, -sore throat, -hoarseness, -sinus pain, -teeth pain, - ringing in ears, -hearing loss, -nosebleeds Cardiology: -chest pain, -palpitations, -swelling, -difficulty breathing when lying flat, -waking up short of breath Respiratory: -cough, -shortness of breath, -difficulty breathing with exercise or exertion, -wheezing, -coughing up blood Gastroenterology: -abdominal pain, -nausea, -vomiting, -diarrhea, -constipation, -blood in stool, -changes in bowel movement, -difficulty swallowing or eating Hematology: -bleeding, -bruising  Musculoskeletal: -joint aches, -muscle aches, -joint swelling, -back pain, -neck pain, -cramping, -changes in gait Ophthalmology: denies vision changes, eye redness, itching, discharge Urology: -burning with urination, -difficulty urinating, -blood in urine, -urinary frequency, -urgency, -incontinence Neurology:  -headache, -weakness, -tingling, -numbness, -memory loss, -falls, -dizziness Psychology: -depressed mood, -agitation, -sleep problems Male GU: no testicular mass, pain, no lymph nodes swollen, no swelling, no rash.     Objective:  BP 138/82   Pulse 76   Ht 5\' 11"  (1.803 m)   Wt 211 lb (95.7 kg)   SpO2 98%   BMI 29.43 kg/m   General appearance: alert, no distress, WD/WN, African American male Skin: unremarkable, there is a round brown slightly raised 4 mm benign-appearing lesion on his left upper buttock HEENT: normocephalic, conjunctiva/corneas normal, sclerae anicteric, PERRLA, EOMi, nares patent, limited exam due to covid precautions Neck: supple, no lymphadenopathy, no thyromegaly, no masses, normal ROM, no bruits Chest: non tender, normal shape and expansion Heart: RRR, normal S1, S2, no murmurs Lungs: CTA bilaterally, no wheezes, rhonchi, or rales Abdomen: +bs, soft, non tender, non distended, no masses, no hepatomegaly, no splenomegaly, no bruits Back: non tender, normal ROM, no scoliosis Musculoskeletal: upper extremities non tender, no obvious deformity, normal ROM throughout, lower extremities non tender, no obvious deformity, normal ROM throughout Extremities: no edema, no cyanosis, no clubbing Pulses: 2+ symmetric, upper and lower extremities, normal cap refill Neurological: alert, oriented x 3, CN2-12 intact, strength normal upper extremities and lower extremities, sensation normal throughout, DTRs 2+ throughout, no cerebellar signs, gait normal Psychiatric: normal affect, behavior normal, pleasant  GU: normal male external genitalia,circumcised, nontender, no masses, no hernia, no lymphadenopathy Rectal: Anus normal tone, prostate within normal limits, there is a brown 4 mm raised papular lesion in the top of the gluteal cleft  EKG Indication physical, hypertension screen for heart disease, rate 62 bpm, PR 148 ms, QRS 108 ms, 412 ms QTc, 14 degree axis, normal sinus  rhythm, incomplete right bundle branch block, no acute change from 2019 EKG   Assessment and Plan :   Encounter Diagnoses  Name Primary?  2020  Encounter for health maintenance examination in adult Yes  . Need for Tdap vaccination   . Screening for prostate cancer   . Screen for colon cancer   . Vaccine counseling   . Encounter for hepatitis C screening test for low risk patient   . Screening for heart disease   . Essential hypertension, benign     Today you had a preventative care visit or wellness visit.    Topics today may have included healthy lifestyle, diet, exercise, preventative care, vaccinations, sick and well care, proper use of emergency dept and after hours care, as well as other concerns.     Recommendations: Continue to return yearly for your annual wellness and preventative care visits.  This gives [place] a chance to discuss healthy lifestyle, exercise, vaccinations, review your chart record, and perform screenings where appropriate.  I recommend you see your eye doctor yearly for routine vision care.  I recommend you see your dentist yearly for routine dental care including hygiene visits twice yearly.   Vaccination recommendations were reviewed Declined flu shot  Counseled on the Tdap (tetanus, diptheria, and acellular pertussis) vaccine.  Vaccine information sheet given. Tdap vaccine given after consent obtained.  Up to date on covid vaccine   Screening for cancer: Colon cancer screening:  Consider colonoscopy vs Cologuard.  He is interested in Cologuard  We discussed PSA, prostate exam, and prostate cancer screening risks/benefits.     Skin cancer screening: Check your skin regularly for new changes, growing lesions, or other lesions of concern Come in for evaluation if you have skin lesions of concern.  Lung cancer screening: If you have a greater than 30 pack year history of tobacco use, then you qualify for lung cancer screening with a chest CT scan  We  currently don't have screenings for other cancers besides breast, cervical, colon, and lung cancers.  If you have a strong family history of cancer or have other cancer screening concerns, please let me know.    Bone health: Get at least 150 minutes of aerobic exercise weekly Get weight bearing exercise at least once weekly   Heart health: Get at least 150 minutes of aerobic exercise weekly Limit alcohol It is important to maintain a healthy blood pressure and healthy cholesterol numbers   Separate significant issues discussed: Hypertension-improved on current medication, EKG reviewed, routine labs today     Darwin was seen today for annual exam.  Diagnoses and all orders for this visit:  Encounter for health maintenance examination in adult -     EKG 12-Lead -     Comprehensive metabolic panel -     CBC with Differential/Platelet -     PSA -     Lipid panel -     Hepatitis C antibody  Need for Tdap vaccination  Screening for prostate cancer -     PSA  Screen for colon cancer  Vaccine counseling  Encounter for hepatitis C screening test for low risk patient -     Hepatitis C antibody  Screening for heart disease -     EKG 12-Lead  Essential hypertension, benign -     EKG 12-Lead  Other orders -     Tdap vaccine greater than or equal to 7yo IM     Follow-up pending labs, yearly for physical

## 2020-08-19 LAB — COMPREHENSIVE METABOLIC PANEL
ALT: 28 IU/L (ref 0–44)
AST: 24 IU/L (ref 0–40)
Albumin/Globulin Ratio: 1.7 (ref 1.2–2.2)
Albumin: 4.6 g/dL (ref 3.8–4.9)
Alkaline Phosphatase: 65 IU/L (ref 44–121)
BUN/Creatinine Ratio: 10 (ref 9–20)
BUN: 12 mg/dL (ref 6–24)
Bilirubin Total: 0.6 mg/dL (ref 0.0–1.2)
CO2: 25 mmol/L (ref 20–29)
Calcium: 9.4 mg/dL (ref 8.7–10.2)
Chloride: 98 mmol/L (ref 96–106)
Creatinine, Ser: 1.26 mg/dL (ref 0.76–1.27)
GFR calc Af Amer: 75 mL/min/{1.73_m2} (ref >59)
GFR calc non Af Amer: 65 mL/min/{1.73_m2} (ref >59)
Globulin, Total: 2.7 g/dL (ref 1.5–4.5)
Glucose: 83 mg/dL (ref 65–99)
Potassium: 4.4 mmol/L (ref 3.5–5.2)
Sodium: 138 mmol/L (ref 134–144)
Total Protein: 7.3 g/dL (ref 6.0–8.5)

## 2020-08-19 LAB — CBC WITH DIFFERENTIAL/PLATELET
Basophils Absolute: 0 10*3/uL (ref 0.0–0.2)
Basos: 1 %
EOS (ABSOLUTE): 0.1 10*3/uL (ref 0.0–0.4)
Eos: 3 %
Hematocrit: 53.9 % — ABNORMAL HIGH (ref 37.5–51.0)
Hemoglobin: 18.1 g/dL — ABNORMAL HIGH (ref 13.0–17.7)
Immature Grans (Abs): 0 10*3/uL (ref 0.0–0.1)
Immature Granulocytes: 0 %
Lymphocytes Absolute: 1.4 10*3/uL (ref 0.7–3.1)
Lymphs: 45 %
MCH: 28.1 pg (ref 26.6–33.0)
MCHC: 33.6 g/dL (ref 31.5–35.7)
MCV: 84 fL (ref 79–97)
Monocytes Absolute: 0.4 10*3/uL (ref 0.1–0.9)
Monocytes: 11 %
Neutrophils Absolute: 1.3 10*3/uL — ABNORMAL LOW (ref 1.4–7.0)
Neutrophils: 40 %
Platelets: 244 10*3/uL (ref 150–450)
RBC: 6.45 x10E6/uL — ABNORMAL HIGH (ref 4.14–5.80)
RDW: 12.8 % (ref 11.6–15.4)
WBC: 3.2 10*3/uL — ABNORMAL LOW (ref 3.4–10.8)

## 2020-08-19 LAB — LIPID PANEL
Chol/HDL Ratio: 4 ratio (ref 0.0–5.0)
Cholesterol, Total: 153 mg/dL (ref 100–199)
HDL: 38 mg/dL — ABNORMAL LOW (ref >39)
LDL Chol Calc (NIH): 94 mg/dL (ref 0–99)
Triglycerides: 112 mg/dL (ref 0–149)
VLDL Cholesterol Cal: 21 mg/dL (ref 5–40)

## 2020-08-19 LAB — PSA: Prostate Specific Ag, Serum: 1.4 ng/mL (ref 0.0–4.0)

## 2020-08-19 LAB — HEPATITIS C ANTIBODY: Hep C Virus Ab: <0.1 s/co ratio (ref 0.0–0.9)

## 2020-08-26 ENCOUNTER — Other Ambulatory Visit: Payer: Self-pay | Admitting: Medical

## 2020-08-26 MED ORDER — LISINOPRIL-HYDROCHLOROTHIAZIDE 10-12.5 MG PO TABS: 1.0000 | ORAL_TABLET | Freq: Every day | ORAL | 3 refills | Status: DC

## 2020-08-27 ENCOUNTER — Other Ambulatory Visit: Payer: Self-pay

## 2020-08-27 DIAGNOSIS — Z1211 Encounter for screening for malignant neoplasm of colon: Secondary | ICD-10-CM

## 2020-08-27 NOTE — Progress Notes (Signed)
Na

## 2020-09-06 DIAGNOSIS — Z1211 Encounter for screening for malignant neoplasm of colon: Secondary | ICD-10-CM

## 2020-09-06 HISTORY — DX: Encounter for screening for malignant neoplasm of colon: Z12.11

## 2020-09-09 LAB — COLOGUARD: Cologuard: NEGATIVE

## 2020-09-16 LAB — COLOGUARD: COLOGUARD: NEGATIVE

## 2020-09-17 ENCOUNTER — Telehealth: Payer: Self-pay | Admitting: Medical

## 2020-09-17 NOTE — Telephone Encounter (Signed)
Please let them know that the Cologuard screening for colon cancer was negative.  This indicates a lower likelihood that colorectal cancer is present.   Lets plan to repeat this in 3 years.  However, if the develop bowel changes, blood in stool, unexpected weight loss, or other new bowel changes, then recheck. 

## 2020-09-18 ENCOUNTER — Encounter: Payer: Self-pay | Admitting: Medical

## 2020-09-18 NOTE — Telephone Encounter (Signed)
Lmom for patient to call for results.

## 2020-09-18 NOTE — Telephone Encounter (Signed)
Letter is being mailed to patient.

## 2020-09-26 ENCOUNTER — Encounter: Payer: Self-pay | Admitting: Medical

## 2020-09-29 ENCOUNTER — Other Ambulatory Visit: Payer: Self-pay | Admitting: Medical

## 2020-09-29 ENCOUNTER — Inpatient Hospital Stay: Admission: RE | Admit: 2020-09-29 | Payer: BC Managed Care – PPO | Source: Ambulatory Visit

## 2020-09-29 DIAGNOSIS — N2889 Other specified disorders of kidney and ureter: Secondary | ICD-10-CM

## 2020-10-12 ENCOUNTER — Inpatient Hospital Stay: Admission: RE | Admit: 2020-10-12 | Payer: BC Managed Care – PPO | Source: Ambulatory Visit

## 2020-11-09 ENCOUNTER — Ambulatory Visit
Admission: RE | Admit: 2020-11-09 | Discharge: 2020-11-09 | Disposition: A | Discharge status: Home / Self Care | Payer: BC Managed Care – PPO | Source: Ambulatory Visit | Attending: Medical | Admitting: Medical

## 2020-11-09 ENCOUNTER — Other Ambulatory Visit: Payer: Self-pay | Admitting: Medical

## 2020-11-09 ENCOUNTER — Other Ambulatory Visit: Payer: Self-pay

## 2020-11-09 DIAGNOSIS — N2889 Other specified disorders of kidney and ureter: Secondary | ICD-10-CM

## 2020-11-09 MED ORDER — GADOBENATE DIMEGLUMINE 529 MG/ML IV SOLN
20.0000 mL | Freq: Once | INTRAVENOUS | Status: AC | PRN
  Administered 2020-11-09: 20 mL via INTRAVENOUS

## 2021-09-11 ENCOUNTER — Encounter: Payer: BC Managed Care – PPO | Admitting: Medical

## 2021-09-21 ENCOUNTER — Other Ambulatory Visit: Payer: Self-pay | Admitting: Medical

## 2021-10-30 ENCOUNTER — Other Ambulatory Visit: Payer: Self-pay

## 2021-10-30 ENCOUNTER — Encounter: Payer: Self-pay | Admitting: Medical

## 2021-10-30 ENCOUNTER — Ambulatory Visit (INDEPENDENT_AMBULATORY_CARE_PROVIDER_SITE_OTHER): Payer: BC Managed Care – PPO | Admitting: Medical

## 2021-10-30 VITALS — BP 120/70 | HR 65 | Ht 70.0 in | Wt 219.2 lb

## 2021-10-30 DIAGNOSIS — Z0000 Encounter for general adult medical examination without abnormal findings: Secondary | ICD-10-CM

## 2021-10-30 DIAGNOSIS — Z125 Encounter for screening for malignant neoplasm of prostate: Secondary | ICD-10-CM | POA: Diagnosis not present

## 2021-10-30 DIAGNOSIS — Z6831 Body mass index (BMI) 31.0-31.9, adult: Secondary | ICD-10-CM

## 2021-10-30 DIAGNOSIS — I10 Essential (primary) hypertension: Secondary | ICD-10-CM | POA: Diagnosis not present

## 2021-10-30 DIAGNOSIS — N400 Enlarged prostate without lower urinary tract symptoms: Secondary | ICD-10-CM

## 2021-10-30 DIAGNOSIS — Z7185 Encounter for immunization safety counseling: Secondary | ICD-10-CM

## 2021-10-30 NOTE — Progress Notes (Signed)
Subjective:   HPI  Dylan Garner is a 56 y.o. male who presents for Chief Complaint  Patient presents with   fasting cpe     Fasting cpe, no other concerns. Declines flu, covid and shingles shot    Patient Care Team: Tyona Nilsen, Kermit Balo, PA-C as PCP - General (Family Medicine) Sees dentist Sees eye doctor  Concerns: HTN - compliant with medication  Reviewed their medical, surgical, family, social, medication, and allergy history and updated chart as appropriate.  Past Medical History:  Diagnosis Date   Colon cancer screening 09/2020   negative cologuard   Hypertension     Past Surgical History:  Procedure Laterality Date   CHOLECYSTECTOMY     MOUTH SURGERY     dental implants    Family History  Problem Relation Age of Onset   Cataracts Mother    Hypertension Father    Hypertension Sister    Hypertension Brother    Hypertension Brother    Cancer Neg Hx    Heart disease Neg Hx    Stroke Neg Hx    Diabetes Neg Hx      Current Outpatient Medications:    lisinopril-hydrochlorothiazide (ZESTORETIC) 10-12.5 MG tablet, TAKE 1 TABLET BY MOUTH EVERY DAY, Disp: 90 tablet, Rfl: 0  No Known Allergies   Review of Systems Constitutional: -fever, -chills, -sweats, -unexpected weight change, -decreased appetite, -fatigue Allergy: -sneezing, -itching, -congestion Dermatology: -changing moles, --rash, -lumps ENT: -runny nose, -ear pain, -sore throat, -hoarseness, -sinus pain, -teeth pain, - ringing in ears, -hearing loss, -nosebleeds Cardiology: -chest pain, -palpitations, -swelling, -difficulty breathing when lying flat, -waking up short of breath Respiratory: -cough, -shortness of breath, -difficulty breathing with exercise or exertion, -wheezing, -coughing up blood Gastroenterology: -abdominal pain, -nausea, -vomiting, -diarrhea, -constipation, -blood in stool, -changes in bowel movement, -difficulty swallowing or eating Hematology: -bleeding, -bruising  Musculoskeletal:  -joint aches, -muscle aches, -joint swelling, -back pain, -neck pain, -cramping, -changes in gait Ophthalmology: denies vision changes, eye redness, itching, discharge Urology: -burning with urination, -difficulty urinating, +small amounts blood in urine chronic since childhood, -urinary frequency, -urgency, -incontinence Neurology: -headache, -weakness, -tingling, -numbness, -memory loss, -falls, -dizziness Psychology: -depressed mood, -agitation, -sleep problems Male GU: no testicular mass, pain, no lymph nodes swollen, no swelling, no rash.      Objective:  BP 120/70    Pulse 65    Ht 5\' 10"  (1.778 m)    Wt 219 lb 3.2 oz (99.4 kg)    BMI 31.45 kg/m   Wt Readings from Last 3 Encounters:  10/30/21 219 lb 3.2 oz (99.4 kg)  08/18/20 211 lb (95.7 kg)  07/14/20 212 lb 3.2 oz (96.3 kg)   BP Readings from Last 3 Encounters:  10/30/21 120/70  08/18/20 138/82  07/14/20 (!) 158/102    General appearance: alert, no distress, WD/WN, African American male Skin: unremarkable, there is a round brown slightly raised 4 mm benign-appearing lesion on his left upper buttock HEENT: normocephalic, conjunctiva/corneas normal, sclerae anicteric, PERRLA, EOMi Neck: supple, no lymphadenopathy, no thyromegaly, no masses, normal ROM, no bruits Chest: non tender, normal shape and expansion Heart: RRR, normal S1, S2, no murmurs Lungs: CTA bilaterally, no wheezes, rhonchi, or rales Abdomen: +bs, soft, non tender, non distended, no masses, no hepatomegaly, no splenomegaly, no bruits Back: non tender, normal ROM, no scoliosis Musculoskeletal: upper extremities non tender, no obvious deformity, normal ROM throughout, lower extremities non tender, no obvious deformity, normal ROM throughout Extremities: no edema, no cyanosis, no clubbing Pulses: 2+ symmetric, upper  and lower extremities, normal cap refill Neurological: alert, oriented x 3, CN2-12 intact, strength normal upper extremities and lower extremities,  sensation normal throughout, DTRs 2+ throughout, no cerebellar signs, gait normal Psychiatric: normal affect, behavior normal, pleasant  GU: normal male external genitalia, nontender, no masses, no hernia, no lymphadenopathy Rectal: declines exam    Assessment and Plan :   Encounter Diagnoses  Name Primary?   Encounter for health maintenance examination in adult Yes   Vaccine counseling    Screening for prostate cancer    Essential hypertension, benign    Enlarged prostate    BMI 31.0-31.9,adult      Today you had a preventative care visit or wellness visit.    Topics today may have included healthy lifestyle, diet, exercise, preventative care, vaccinations, sick and well care, proper use of emergency dept and after hours care, as well as other concerns.     Recommendations: Continue to return yearly for your annual wellness and preventative care visits.  This gives [place] a chance to discuss healthy lifestyle, exercise, vaccinations, review your chart record, and perform screenings where appropriate.  I recommend you see your eye doctor yearly for routine vision care.  I recommend you see your dentist yearly for routine dental care including hygiene visits twice yearly.   Vaccination recommendations were reviewed Immunization History  Administered Date(s) Administered   PFIZER(Purple Top)SARS-COV-2 Vaccination 03/31/2020, 04/21/2020   Tdap 08/18/2020   Shingles vaccine:  I recommend you have a shingles vaccine to help prevent shingles or herpes zoster outbreak.   Please call your insurer to inquire about coverage for the Shingrix vaccine given in 2 doses.   Some insurers cover this vaccine after age 9, some cover this after age 43.  If your insurer covers this, then call to schedule appointment to have this vaccine here.    Screening for cancer: Colon cancer screening:  Cologuard negative 09/08/20  We discussed PSA, prostate exam, and prostate cancer screening risks/benefits.      Skin cancer screening: Check your skin regularly for new changes, growing lesions, or other lesions of concern Come in for evaluation if you have skin lesions of concern.  Lung cancer screening: If you have a greater than 30 pack year history of tobacco use, then you qualify for lung cancer screening with a chest CT scan  We currently don't have screenings for other cancers besides breast, cervical, colon, and lung cancers.  If you have a strong family history of cancer or have other cancer screening concerns, please let me know.    Bone health: Get at least 150 minutes of aerobic exercise weekly Get weight bearing exercise at least once weekly   Heart health: Get at least 150 minutes of aerobic exercise weekly Limit alcohol It is important to maintain a healthy blood pressure and healthy cholesterol numbers   Separate significant issues discussed: Hypertension - your blood pressure looks great.  Continue current medication  Elevated red cells on prior labs - recheck today     Eliott was seen today for fasting cpe .  Diagnoses and all orders for this visit:  Encounter for health maintenance examination in adult -     CBC with Differential/Platelet -     Comprehensive metabolic panel -     Lipid panel -     PSA -     Urinalysis, Routine w reflex microscopic  Vaccine counseling  Screening for prostate cancer  Essential hypertension, benign -     Comprehensive metabolic panel  Enlarged prostate -     PSA  BMI 31.0-31.9,adult      Follow-up pending labs, yearly for physical

## 2021-10-30 NOTE — Patient Instructions (Addendum)
Today you had a preventative care visit or wellness visit.    Topics today may have included healthy lifestyle, diet, exercise, preventative care, vaccinations, sick and well care, proper use of emergency dept and after hours care, as well as other concerns.     Recommendations: Continue to return yearly for your annual wellness and preventative care visits.  This gives [place] a chance to discuss healthy lifestyle, exercise, vaccinations, review your chart record, and perform screenings where appropriate.  I recommend you see your eye doctor yearly for routine vision care.  I recommend you see your dentist yearly for routine dental care including hygiene visits twice yearly.   Vaccination recommendations were reviewed Immunization History  Administered Date(s) Administered   PFIZER(Purple Top)SARS-COV-2 Vaccination 03/31/2020, 04/21/2020   Tdap 08/18/2020   Shingles vaccine:  I recommend you have a shingles vaccine to help prevent shingles or herpes zoster outbreak.   Please call your insurer to inquire about coverage for the Shingrix vaccine given in 2 doses.   Some insurers cover this vaccine after age 47, some cover this after age 56.  If your insurer covers this, then call to schedule appointment to have this vaccine here.    Screening for cancer: Colon cancer screening:  Cologuard negative 09/08/20  We discussed PSA, prostate exam, and prostate cancer screening risks/benefits.     Skin cancer screening: Check your skin regularly for new changes, growing lesions, or other lesions of concern Come in for evaluation if you have skin lesions of concern.  Lung cancer screening: If you have a greater than 30 pack year history of tobacco use, then you qualify for lung cancer screening with a chest CT scan  We currently don't have screenings for other cancers besides breast, cervical, colon, and lung cancers.  If you have a strong family history of cancer or have other cancer screening  concerns, please let me know.    Bone health: Get at least 150 minutes of aerobic exercise weekly Get weight bearing exercise at least once weekly   Heart health: Get at least 150 minutes of aerobic exercise weekly Limit alcohol It is important to maintain a healthy blood pressure and healthy cholesterol numbers   Separate significant issues discussed: Hypertension - your blood pressure looks great.  Continue current medication  Elevated red cells on prior labs - recheck today.   One cause could be polycythemia vera or other causes       Polycythemia Vera Polycythemia vera (PV), or myeloproliferative disease, is a form of blood cancer in which the bone marrow makes too many (overproduces) red blood cells. The bone marrow may also make too many clotting cells (platelets) and white blood cells. Bone marrow is the spongy center of bones where blood cells are produced. Sometimes, there may be an overproduction of blood cells in the liver and spleen, causing those organs to become enlarged. Additionally, people who have PV are at a higher risk for stroke or heart attack because their blood may clot more easily. PV is a long-term (chronic)disease. What are the causes? Almost all people who have PV have an abnormal gene (genetic mutation) that causes changes in the way that the bone marrow makes blood cells. This gene, which is called JAK2, is not passed along from parent to child (is not hereditary). It is not known what triggers the genetic mutation that causes the body to produce too many red blood cells. What increases the risk? You are more likely to develop this condition  if you are: Male. 71 years of age or older. What are the signs or symptoms? You may not have any symptoms in the early stage of PV. When symptoms develop, they may include: Shortness of breath. Dizziness. Hot and flushed skin. Itchy skin. Sweats, especially night sweats. Headache. Tiredness. Ringing in the  ears. Blurred vision or blind spots. Bone pain. Weight loss. Fever. Blood-tinged vomit or stool. How is this diagnosed? This condition may be diagnosed during a routine physical exam and a blood test called a complete blood count (CBC). Your health care provider also may suspect PV if you have symptoms. During the physical exam, your provider may find that you have an enlarged liver or spleen. You may also have tests to confirm the diagnosis. These may include: A procedure to remove a sample of bone marrow for testing (bone marrow biopsy). Blood tests to check for: The JAK2 gene. Low levels of a hormone that helps to regulate blood production (erythropoietin). How is this treated? There is no cure for PV, but treatment can help to control the disease. There are several types of treatment. No single treatment works for everyone. You will need to work with a blood cancer specialist (hematologist) to find the treatment that is best for you. This condition may be treated by: Periodically having some blood removed with a needle (drawn) to lower the number of red blood cells (phlebotomy). Taking medicine. Your health care provider may recommend: Low-dose aspirin to lower your risk for blood clots. A medicine to reduce red blood cell production. A medicine to lower the number of red blood cells. A medicine that slows down the effects of JAK2 gene. Other medicines to treat symptoms such as itching. Follow these instructions at home:  Take over-the-counter and prescription medicines only as told by your health care provider. Return to your normal activities as told by your health care provider. Ask your health care provider what activities are safe for you. Do regular exercise as told by your health care provider. Check your hands and feet regularly for any sores that do not heal. Do not use any products that contain nicotine or tobacco, such as cigarettes, e-cigarettes, and chewing tobacco. If you  need help quitting, ask your health care provider. Keep all follow-up visits as told by your health care provider. This is important. Contact a health care provider if: You have side effects from your medicines. Your symptoms change or get worse at home. You have blood in your stool or you vomit blood. Get help right away if: You have sudden and severe pain in your abdomen. You have chest pain or difficulty breathing. You have signs of stroke, such as: Sudden numbness. Weakness of your face or arm. Confusion. Difficulty speaking or understanding speech. These symptoms may represent a serious problem that is an emergency. Do not wait to see if the symptoms will go away. Get medical help right away. Call your local emergency services (911 in the [place] Do not drive yourself to the hospital. Summary Polycythemia vera is a form of blood cancer in which the bone marrow makes too many red blood cells. People who have polycythemia vera are at a higher risk for stroke or heart attack because their blood may clot more easily. The disease is caused by a genetic mutation that causes changes in the way that the bone marrow makes blood cells. It is diagnosed with blood tests and a bone marrow biopsy. There is no cure for PV, but treatment can  help to control the disease. It is treated by periodically having some blood removed and by taking medicines. This information is not intended to replace advice given to you by your health care provider. Make sure you discuss any questions you have with your health care provider. Document Revised: 06/29/2018 Document Reviewed: 06/29/2018 Elsevier Patient Education  2022 [employer].

## 2021-10-31 LAB — URINALYSIS, ROUTINE W REFLEX MICROSCOPIC
Bilirubin, UA: NEGATIVE
Glucose, UA: NEGATIVE
Ketones, UA: NEGATIVE
Nitrite, UA: NEGATIVE
Specific Gravity, UA: 1.028 (ref 1.005–1.030)
Urobilinogen, Ur: 0.2 mg/dL (ref 0.2–1.0)
pH, UA: 5.5 (ref 5.0–7.5)

## 2021-10-31 LAB — CBC WITH DIFFERENTIAL/PLATELET
Basophils Absolute: 0 10*3/uL (ref 0.0–0.2)
Basos: 1 %
EOS (ABSOLUTE): 0.1 10*3/uL (ref 0.0–0.4)
Eos: 3 %
Hematocrit: 56.9 % — ABNORMAL HIGH (ref 37.5–51.0)
Hemoglobin: 19 g/dL — ABNORMAL HIGH (ref 13.0–17.7)
Immature Grans (Abs): 0 10*3/uL (ref 0.0–0.1)
Immature Granulocytes: 0 %
Lymphocytes Absolute: 2 10*3/uL (ref 0.7–3.1)
Lymphs: 50 %
MCH: 29.4 pg (ref 26.6–33.0)
MCHC: 33.4 g/dL (ref 31.5–35.7)
MCV: 88 fL (ref 79–97)
Monocytes Absolute: 0.4 10*3/uL (ref 0.1–0.9)
Monocytes: 10 %
Neutrophils Absolute: 1.4 10*3/uL (ref 1.4–7.0)
Neutrophils: 36 %
Platelets: 187 10*3/uL (ref 150–450)
RBC: 6.47 x10E6/uL — ABNORMAL HIGH (ref 4.14–5.80)
RDW: 12.3 % (ref 11.6–15.4)
WBC: 3.9 10*3/uL (ref 3.4–10.8)

## 2021-10-31 LAB — COMPREHENSIVE METABOLIC PANEL
ALT: 30 IU/L (ref 0–44)
AST: 31 IU/L (ref 0–40)
Albumin/Globulin Ratio: 1.6 (ref 1.2–2.2)
Albumin: 4.4 g/dL (ref 3.8–4.9)
Alkaline Phosphatase: 66 IU/L (ref 44–121)
BUN/Creatinine Ratio: 12 (ref 9–20)
BUN: 16 mg/dL (ref 6–24)
Bilirubin Total: 1.3 mg/dL — ABNORMAL HIGH (ref 0.0–1.2)
CO2: 23 mmol/L (ref 20–29)
Calcium: 9.3 mg/dL (ref 8.7–10.2)
Chloride: 100 mmol/L (ref 96–106)
Creatinine, Ser: 1.34 mg/dL — ABNORMAL HIGH (ref 0.76–1.27)
Globulin, Total: 2.8 g/dL (ref 1.5–4.5)
Glucose: 99 mg/dL (ref 70–99)
Potassium: 4.4 mmol/L (ref 3.5–5.2)
Sodium: 139 mmol/L (ref 134–144)
Total Protein: 7.2 g/dL (ref 6.0–8.5)
eGFR: 63 mL/min/{1.73_m2} (ref >59)

## 2021-10-31 LAB — LIPID PANEL
Chol/HDL Ratio: 5.1 ratio — ABNORMAL HIGH (ref 0.0–5.0)
Cholesterol, Total: 178 mg/dL (ref 100–199)
HDL: 35 mg/dL — ABNORMAL LOW (ref >39)
LDL Chol Calc (NIH): 112 mg/dL — ABNORMAL HIGH (ref 0–99)
Triglycerides: 174 mg/dL — ABNORMAL HIGH (ref 0–149)
VLDL Cholesterol Cal: 31 mg/dL (ref 5–40)

## 2021-10-31 LAB — MICROSCOPIC EXAMINATION
Bacteria, UA: NONE SEEN
Casts: NONE SEEN /lpf
Epithelial Cells (non renal): NONE SEEN /hpf (ref 0–10)

## 2021-10-31 LAB — PSA: Prostate Specific Ag, Serum: 1.7 ng/mL (ref 0.0–4.0)

## 2021-11-03 ENCOUNTER — Other Ambulatory Visit: Payer: Self-pay | Admitting: Medical

## 2021-11-05 ENCOUNTER — Other Ambulatory Visit: Payer: Self-pay | Admitting: Medical

## 2021-11-06 ENCOUNTER — Other Ambulatory Visit: Payer: Self-pay | Admitting: Internal Medicine

## 2021-11-06 ENCOUNTER — Other Ambulatory Visit: Payer: Self-pay | Admitting: Medical

## 2021-11-06 DIAGNOSIS — R718 Other abnormality of red blood cells: Secondary | ICD-10-CM

## 2021-11-06 DIAGNOSIS — R3129 Other microscopic hematuria: Secondary | ICD-10-CM

## 2021-11-06 MED ORDER — ROSUVASTATIN CALCIUM 10 MG PO TABS: 10.0000 mg | ORAL_TABLET | Freq: Every day | ORAL | 0 refills | Status: DC

## 2021-11-06 MED ORDER — LISINOPRIL-HYDROCHLOROTHIAZIDE 10-12.5 MG PO TABS: 1.0000 | ORAL_TABLET | Freq: Every day | ORAL | 3 refills | Status: DC

## 2021-11-13 ENCOUNTER — Other Ambulatory Visit: Payer: BC Managed Care – PPO

## 2021-11-13 ENCOUNTER — Other Ambulatory Visit: Payer: Self-pay

## 2021-11-13 DIAGNOSIS — R718 Other abnormality of red blood cells: Secondary | ICD-10-CM

## 2021-11-17 LAB — HGB FRACTIONATION CASCADE
Hgb A2: 2.4 % (ref 1.8–3.2)
Hgb A: 97.6 % (ref 96.4–98.8)
Hgb F: 0 % (ref 0.0–2.0)
Hgb S: 0 %

## 2021-11-19 LAB — PATHOLOGIST SMEAR REVIEW
Basophils Absolute: 0 10*3/uL (ref 0.0–0.2)
Basos: 1 %
EOS (ABSOLUTE): 0.2 10*3/uL (ref 0.0–0.4)
Eos: 4 %
Hematocrit: 55.2 % — ABNORMAL HIGH (ref 37.5–51.0)
Hemoglobin: 18.9 g/dL — ABNORMAL HIGH (ref 13.0–17.7)
Immature Grans (Abs): 0 10*3/uL (ref 0.0–0.1)
Immature Granulocytes: 0 %
Lymphocytes Absolute: 1.9 10*3/uL (ref 0.7–3.1)
Lymphs: 51 %
MCH: 30 pg (ref 26.6–33.0)
MCHC: 34.2 g/dL (ref 31.5–35.7)
MCV: 88 fL (ref 79–97)
Monocytes Absolute: 0.4 10*3/uL (ref 0.1–0.9)
Monocytes: 11 %
Neutrophils Absolute: 1.3 10*3/uL — ABNORMAL LOW (ref 1.4–7.0)
Neutrophils: 33 %
Platelets: 221 10*3/uL (ref 150–450)
RBC: 6.29 x10E6/uL — ABNORMAL HIGH (ref 4.14–5.80)
RDW: 12.8 % (ref 11.6–15.4)
WBC: 3.7 10*3/uL (ref 3.4–10.8)

## 2021-11-20 ENCOUNTER — Other Ambulatory Visit: Payer: Self-pay | Admitting: Medical

## 2021-11-20 DIAGNOSIS — D751 Secondary polycythemia: Secondary | ICD-10-CM

## 2021-11-23 ENCOUNTER — Telehealth: Payer: Self-pay | Admitting: Hematology and Oncology

## 2021-11-23 NOTE — Telephone Encounter (Signed)
Scheduled appt per 3/17 referral. Pt is aware of appt date and time. Pt is aware to arrive 15 mins prior to appt time and to bring and updated insurance card. Pt is aware of appt location.   ?

## 2021-12-04 ENCOUNTER — Inpatient Hospital Stay: Payer: BC Managed Care – PPO | Attending: Hematology and Oncology | Admitting: Hematology and Oncology

## 2021-12-04 ENCOUNTER — Other Ambulatory Visit: Payer: Self-pay

## 2021-12-04 ENCOUNTER — Inpatient Hospital Stay: Payer: BC Managed Care – PPO

## 2021-12-04 DIAGNOSIS — Z9049 Acquired absence of other specified parts of digestive tract: Secondary | ICD-10-CM | POA: Diagnosis not present

## 2021-12-04 DIAGNOSIS — Z7289 Other problems related to lifestyle: Secondary | ICD-10-CM

## 2021-12-04 DIAGNOSIS — Z79899 Other long term (current) drug therapy: Secondary | ICD-10-CM | POA: Diagnosis not present

## 2021-12-04 DIAGNOSIS — I10 Essential (primary) hypertension: Secondary | ICD-10-CM | POA: Diagnosis not present

## 2021-12-04 DIAGNOSIS — F17220 Nicotine dependence, chewing tobacco, uncomplicated: Secondary | ICD-10-CM | POA: Insufficient documentation

## 2021-12-04 DIAGNOSIS — Z83518 Family history of other specified eye disorder: Secondary | ICD-10-CM | POA: Diagnosis not present

## 2021-12-04 DIAGNOSIS — D751 Secondary polycythemia: Secondary | ICD-10-CM | POA: Diagnosis present

## 2021-12-04 DIAGNOSIS — Z8249 Family history of ischemic heart disease and other diseases of the circulatory system: Secondary | ICD-10-CM | POA: Insufficient documentation

## 2021-12-04 LAB — CBC WITH DIFFERENTIAL (CANCER CENTER ONLY)
Abs Immature Granulocytes: 0 10*3/uL (ref 0.00–0.07)
Basophils Absolute: 0 10*3/uL (ref 0.0–0.1)
Basophils Relative: 1 %
Eosinophils Absolute: 0.1 10*3/uL (ref 0.0–0.5)
Eosinophils Relative: 3 %
HCT: 56.8 % — ABNORMAL HIGH (ref 39.0–52.0)
Hemoglobin: 18.8 g/dL — ABNORMAL HIGH (ref 13.0–17.0)
Immature Granulocytes: 0 %
Lymphocytes Relative: 54 %
Lymphs Abs: 1.7 10*3/uL (ref 0.7–4.0)
MCH: 29.1 pg (ref 26.0–34.0)
MCHC: 33.1 g/dL (ref 30.0–36.0)
MCV: 88.1 fL (ref 80.0–100.0)
Monocytes Absolute: 0.3 10*3/uL (ref 0.1–1.0)
Monocytes Relative: 10 %
Neutro Abs: 1 10*3/uL — ABNORMAL LOW (ref 1.7–7.7)
Neutrophils Relative %: 32 %
Platelet Count: 194 10*3/uL (ref 150–400)
RBC: 6.45 MIL/uL — ABNORMAL HIGH (ref 4.22–5.81)
RDW: 12.5 % (ref 11.5–15.5)
WBC Count: 3.1 10*3/uL — ABNORMAL LOW (ref 4.0–10.5)
nRBC: 0 % (ref 0.0–0.2)

## 2021-12-04 NOTE — Assessment & Plan Note (Signed)
Lab review: ?02/01/2011: Hemoglobin 14.9, hematocrit 42.6 ?07/27/2013: Hemoglobin 17.3, hematocrit 54.9 ?08/31/2018: Hemoglobin 16.8 hematocrit 52.7 ?08/18/2020: Hemoglobin 18.1, hematocrit 53.9 ?11/13/2021: Hemoglobin 18.9, hematocrit 55.2 ? ?I discussed with the patient extensively the differential diagnosis of polycythemia ?1. Primary polycythemia due to clonal stem cell abnormality ?2. Secondary polycythemia due to cause that include hypoxia like COPD, smoking, obstructive sleep apnea, heart or lung problems, altitude, athletics, erythropoietin producing lesion/tumors etc ? ?Recommendation: ?1. JAK-2 mutation testing to evaluate for polycythemia vera ?2. Erythropoietin level ? Drink 8 glasses of water per day ? ?Indications for phlebotomy ?1. Primary polycythemia with hematocrit over 55 ?2. Secondary polycythemia with severe symptoms which include strokelike symptoms, severe recurrent headaches, severe fatigue. ? ?Patient does not have any clear-cut symptoms that would require phlebotomy at this time. ?If the erythropoietin is elevated, he will need ultrasound of the liver and kidney for further evaluation.  ? ?Return to clinic in 2 weeks to discuss results ? ? ?

## 2021-12-04 NOTE — Progress Notes (Signed)
[facility] ?CONSULT NOTE ? ?Patient Care Team: ?Tysinger, Leward Quan as PCP - General (Family Medicine) ? ?CHIEF COMPLAINTS/PURPOSE OF CONSULTATION:  ?Erythrocytosis ? ?HISTORY OF PRESENTING ILLNESS:  ?Dylan Garner 56 y.o. male is here because of recent diagnosis of Erythrocytosis He presents to the clinic today for a consult and labs.  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  He drives all the way from [place] to [place] then to [place] and back almost every week.  He also smokes smokeless tobacco products.  He does not know whether he snores.  He is never been diagnosed with sleep apnea.  Over the past several checks his hemoglobin levels have been trending upwards and therefore he was referred to [place] for evaluation. ? ?I reviewed her records extensively and collaborated the history with the patient. ?  ? ?MEDICAL HISTORY:  ?Past Medical History:  ?Diagnosis Date  ? Colon cancer screening 09/2020  ? negative cologuard  ? Hypertension   ? ? ?SURGICAL HISTORY: ?Past Surgical History:  ?Procedure Laterality Date  ? CHOLECYSTECTOMY    ? MOUTH SURGERY    ? dental implants  ? ? ?SOCIAL HISTORY: ?Social History  ? ?Socioeconomic History  ? Marital status: Married  ?  Spouse name: Not on file  ? Number of children: Not on file  ? Years of education: Not on file  ? Highest education level: Not on file  ?Occupational History  ? Not on file  ?Tobacco Use  ? Smoking status: Never  ? Smokeless tobacco: Never  ?Vaping Use  ? Vaping Use: Never used  ?Substance and Sexual Activity  ? Alcohol use: Yes  ?  Alcohol/week: 3.0 standard drinks  ?  Types: 1 Cans of beer, 2 Shots of liquor per week  ? Drug use: No  ? Sexual activity: Never  ?  Birth control/protection: Abstinence  ?Other Topics Concern  ? Not on file  ?Social History Narrative  ? Married, 1 son and 1 step-son, 1 dog.   Exercise - walking.  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   10/2021  ? ?Social Determinants of Health  ? ?Financial Resource Strain: Not on file  ?Food Insecurity: Not on file  ?Transportation Needs: Not on file  ?Physical Activity: Not on file  ?Stress: Not on file  ?Social Connections: Not on file  ?Intimate Partner Violence: Not on file  ? ? ?FAMILY HISTORY: ?Family History  ?Problem Relation Age of Onset  ? Cataracts Mother   ? Hypertension Father   ? Hypertension Sister   ? Hypertension Brother   ? Hypertension Brother   ? Cancer Neg Hx   ? Heart disease Neg Hx   ? Stroke Neg Hx   ? Diabetes Neg Hx   ? ? ?ALLERGIES:  has No Known Allergies. ? ?MEDICATIONS:  ?Current Outpatient Medications  ?Medication Sig Dispense Refill  ? rosuvastatin (CRESTOR) 10 MG tablet Take 1 tablet (10 mg total) by mouth daily. 90 tablet 0  ? lisinopril-hydrochlorothiazide (ZESTORETIC) 10-12.5 MG tablet Take 1 tablet by mouth daily. 90 tablet 3  ? rosuvastatin (CRESTOR) 10 MG tablet Take 1 tablet (10 mg total) by mouth daily. 90 tablet 0  ? ?No current facility-administered medications for this visit.  ? ? ?REVIEW OF SYSTEMS:   ?Constitutional: Denies fevers, chills or abnormal night sweats ?  ?All other systems were reviewed with the patient and are negative. ? ?PHYSICAL EXAMINATION: ?  ECOG PERFORMANCE STATUS: 1 - Symptomatic but completely ambulatory ? ?Vitals:  ? 12/04/21 1420  ?BP: (!) 142/98  ?Pulse: 93  ?Resp: 18  ?Temp: (!) 97.5 ?F (36.4 ?C)  ?SpO2: 96%  ? ?Filed Weights  ? 12/04/21 1420  ?Weight: 220 lb (99.8 kg)  ? ?  ? ?LABORATORY DATA:  ?I have reviewed the data as listed ?Lab Results  ?Component Value Date  ? WBC 3.7 11/13/2021  ? HGB 18.9 (H) 11/13/2021  ? HCT 55.2 (H) 11/13/2021  ? MCV 88 11/13/2021  ? PLT 221 11/13/2021  ? ?Lab Results  ?Component Value Date  ? NA 139 10/30/2021  ? K 4.4 10/30/2021  ? CL 100 10/30/2021  ? CO2 23 10/30/2021  ? ? ?RADIOGRAPHIC STUDIES: ?I have personally reviewed the radiological reports and agreed with the findings in the report. ? ?ASSESSMENT AND  PLAN:  ?Polycythemia, secondary ?Lab review: ?02/01/2011: Hemoglobin 14.9, hematocrit 42.6 ?07/27/2013: Hemoglobin 17.3, hematocrit 54.9 ?08/31/2018: Hemoglobin 16.8 hematocrit 52.7 ?08/18/2020: Hemoglobin 18.1, hematocrit 53.9 ?11/13/2021: Hemoglobin 18.9, hematocrit 55.2 ? ?I discussed with the patient extensively the differential diagnosis of polycythemia ?1. Primary polycythemia due to clonal stem cell abnormality ?2. Secondary polycythemia due to cause that include hypoxia like COPD, smoking, obstructive sleep apnea, heart or lung problems, altitude, athletics, erythropoietin producing lesion/tumors etc ? ?Recommendation: ?1. JAK-2 mutation testing to evaluate for polycythemia vera ?2. Erythropoietin level ?3.  Recheck CBC today ? ? Drink 8 glasses of water per day ? ?Indications for phlebotomy ?1. Primary polycythemia with hematocrit over 55 ?2. Secondary polycythemia with severe symptoms which include strokelike symptoms, severe recurrent headaches, severe fatigue. ? ?Patient does not have any clear-cut symptoms that would require phlebotomy at this time. ?If the erythropoietin is elevated, he will need ultrasound of the liver and kidney for further evaluation.  ? 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  That may be a reason for his elevated hemoglobin levels. ? ?Return to clinic in 1 week to discuss results ? ? ? ?All questions were answered. The patient knows to call the clinic with any problems, questions or concerns. ?  ? Harriette Ohara, MD ?12/04/21 ?  ?I Gardiner Coins am scribing for Dr. Lindi Adie ? ?I have reviewed the above documentation for accuracy and completeness, and I agree with the above. ?  ?

## 2021-12-05 LAB — ERYTHROPOIETIN: Erythropoietin: 7.1 m[IU]/mL (ref 2.6–18.5)

## 2021-12-09 LAB — JAK2 (INCLUDING V617F AND EXON 12), MPL,& CALR W/RFL MPN PANEL (NGS)

## 2021-12-09 NOTE — Progress Notes (Signed)
HEMATOLOGY-ONCOLOGY TELEPHONE VISIT PROGRESS NOTE ? ?I connected with Mr Dylan Garner on 12/10/21 at 12:15 PM EDT by telephone and verified that I am speaking with the correct person using two identifiers.  ?I discussed the limitations, risks, security and privacy concerns of performing an evaluation and management service by telephone and the availability of in person appointments.  ?I also discussed with the patient that there may be a patient responsible charge related to this service. The patient expressed understanding and agreed to proceed.  ? ?CHIEF COMPLAINTS/PURPOSE OF CONSULTATION:  ?Erythrocytosis ? ?History of Present Illness: Dylan Garner 56 y.o. male is here because of recent diagnosis of Erythrocytosis He presents to the clinic today for via telephone follow-up.  He reports no new problems or concerns.  He is starting to drink more water up to 36 ounces per day.  ? ?REVIEW OF SYSTEMS:   ?Constitutional: Denies fevers, chills or abnormal weight loss ?  ?All other systems were reviewed with the patient and are negative. ?Observations/Objective:  ? ?  ?Assessment Plan:  ?Polycythemia, secondary ?Lab review: ?02/01/2011: Hemoglobin 14.9, hematocrit 42.6 ?07/27/2013: Hemoglobin 17.3, hematocrit 54.9 ?08/31/2018: Hemoglobin 16.8 hematocrit 52.7 ?08/18/2020: Hemoglobin 18.1, hematocrit 53.9 ?11/13/2021: Hemoglobin 18.9, hematocrit 55.2 ?  ?  ?Recommendation: ?1.  MPN panel: Negative for mutations ?2. Erythropoietin level: 7.1 (appropriately suppressed)  ?3.    12/04/2021: Hemoglobin 18.8, WBC 3.1, platelets 194, ANC 1 ?  ?Patient is now drinking more water up to 36 ounces per day.  I discussed with him that this is the only way his hemoglobin will improve. ?  ?Indications for phlebotomy ?He does not have any current indications for phlebotomy. ?Once his fluid intake improves I believe his hemoglobin will come down. ?The other potential possibility is obstructive sleep apnea. ? 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  That may be a reason for his elevated hemoglobin levels. ? ?Return to clinic in 4 months with labs ? ?I discussed the assessment and treatment plan with the patient. The patient was provided an opportunity to ask questions and all were answered. The patient agreed with the plan and demonstrated an understanding of the instructions. The patient was advised to call back or seek an in-person evaluation if the symptoms worsen or if the condition fails to improve as anticipated.  ? ?I provided 12 minutes of non-face-to-face time during this encounter. Tamsen Meek, MD   ?Audry Riles am scribing for Dr. Pamelia Hoit ? ?I have reviewed the above documentation for accuracy and completeness, and I agree with the above. ?  ?

## 2021-12-10 ENCOUNTER — Inpatient Hospital Stay: Payer: BC Managed Care – PPO | Attending: Hematology and Oncology | Admitting: Hematology and Oncology

## 2021-12-10 DIAGNOSIS — D751 Secondary polycythemia: Secondary | ICD-10-CM | POA: Diagnosis not present

## 2021-12-10 NOTE — Assessment & Plan Note (Signed)
Lab review: ?02/01/2011: Hemoglobin 14.9, hematocrit 42.6 ?07/27/2013: Hemoglobin 17.3, hematocrit 54.9 ?08/31/2018: Hemoglobin 16.8 hematocrit 52.7 ?08/18/2020: Hemoglobin 18.1, hematocrit 53.9 ?11/13/2021: Hemoglobin 18.9, hematocrit 55.2 ?? ?I discussed with the patient extensively the differential diagnosis of polycythemia ?1. Primary polycythemia due to clonal stem cell abnormality ?2. Secondary polycythemia due to cause that include hypoxia like COPD, smoking, obstructive sleep apnea, heart or lung problems, altitude, athletics, erythropoietin producing lesion/tumors etc ?? ?Recommendation: ?1. JAK-2 mutation testing to evaluate for polycythemia vera ?2. Erythropoietin level: 7.1 (appropriately suppressed)  ?3.    12/04/2021: Hemoglobin 18.8, WBC 3.1, platelets 194, ANC 1 ?? ? Drink 8 glasses of water per day ?? ?Indications for phlebotomy ?1. Primary polycythemia with hematocrit over 55 ?2. Secondary polycythemia with severe symptoms which include strokelike symptoms, severe recurrent headaches, severe fatigue. ? 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  That may be a reason for his elevated hemoglobin levels. ?

## 2021-12-11 ENCOUNTER — Telehealth: Payer: Self-pay | Admitting: Hematology and Oncology

## 2021-12-11 NOTE — Telephone Encounter (Signed)
Scheduled appointment per 4/6 los. Patient is aware. ?

## 2022-02-03 ENCOUNTER — Other Ambulatory Visit: Payer: Self-pay | Admitting: Medical

## 2022-03-29 ENCOUNTER — Ambulatory Visit: Payer: BC Managed Care – PPO | Admitting: Medical

## 2022-03-29 ENCOUNTER — Encounter: Payer: Self-pay | Admitting: Medical

## 2022-03-29 VITALS — BP 120/80 | HR 99 | Wt 217.0 lb

## 2022-03-29 DIAGNOSIS — E782 Mixed hyperlipidemia: Secondary | ICD-10-CM | POA: Diagnosis not present

## 2022-03-29 DIAGNOSIS — R3129 Other microscopic hematuria: Secondary | ICD-10-CM

## 2022-03-29 DIAGNOSIS — D751 Secondary polycythemia: Secondary | ICD-10-CM | POA: Diagnosis not present

## 2022-03-29 LAB — POCT URINALYSIS DIP (PROADVANTAGE DEVICE)
Bilirubin, UA: NEGATIVE
Glucose, UA: NEGATIVE mg/dL
Ketones, POC UA: NEGATIVE mg/dL
Leukocytes, UA: NEGATIVE
Nitrite, UA: NEGATIVE
Protein Ur, POC: NEGATIVE mg/dL
Specific Gravity, Urine: 1.03
Urobilinogen, Ur: 0.2
pH, UA: 6 (ref 5.0–8.0)

## 2022-03-29 LAB — CBC WITH DIFFERENTIAL/PLATELET
Basophils Absolute: 0 10*3/uL (ref 0.0–0.2)
Basos: 1 %
EOS (ABSOLUTE): 0.1 10*3/uL (ref 0.0–0.4)
Eos: 1 %
Hematocrit: 52.4 % — ABNORMAL HIGH (ref 37.5–51.0)
Hemoglobin: 17.5 g/dL (ref 13.0–17.7)
Immature Grans (Abs): 0 10*3/uL (ref 0.0–0.1)
Immature Granulocytes: 0 %
Lymphocytes Absolute: 2.5 10*3/uL (ref 0.7–3.1)
Lymphs: 55 %
MCH: 30.2 pg (ref 26.6–33.0)
MCHC: 33.4 g/dL (ref 31.5–35.7)
MCV: 90 fL (ref 79–97)
Monocytes Absolute: 0.5 10*3/uL (ref 0.1–0.9)
Monocytes: 11 %
Neutrophils Absolute: 1.5 10*3/uL (ref 1.4–7.0)
Neutrophils: 32 %
Platelets: 295 10*3/uL (ref 150–450)
RBC: 5.8 x10E6/uL (ref 4.14–5.80)
RDW: 12.9 % (ref 11.6–15.4)
WBC: 4.6 10*3/uL (ref 3.4–10.8)

## 2022-03-29 MED ORDER — ROSUVASTATIN CALCIUM 10 MG PO TABS: 10.0000 mg | ORAL_TABLET | ORAL | 3 refills | Status: DC

## 2022-03-29 NOTE — Progress Notes (Signed)
Subjective:  Dylan Garner is a 56 y.o. male who presents for Chief Complaint  Patient presents with   Follow-up    Follow up     Here for follow-up.  I saw him back in February 2023 for physical.  At that time he had elevated red cells.  We referred to oncology hematology and they did a work-up.  They did not feel like it was anything serious.  He has been doing blood donations.  I recommend he do a blood donation every 56 days which is his plan.  He last donated blood 2 weeks ago.  He sees hematology again in August  He is not taking Crestor.  He was curious if he still needs to be on this.  He does not smoke.  Hypertension-compliant with medication  Otherwise in usual state of health without complaint  Past Medical History:  Diagnosis Date   Colon cancer screening 09/2020   negative cologuard   Hypertension    Current Outpatient Medications on File Prior to Visit  Medication Sig Dispense Refill   lisinopril-hydrochlorothiazide (ZESTORETIC) 10-12.5 MG tablet Take 1 tablet by mouth daily. 90 tablet 3   [DISCONTINUED] famotidine (PEPCID) 20 MG tablet Take 1 tablet (20 mg total) by mouth 2 (two) times daily for 5 days. 10 tablet 0   [DISCONTINUED] sucralfate (CARAFATE) 1 g tablet Take 1 tablet (1 g total) by mouth 4 (four) times daily -  with meals and at bedtime for 5 days. 20 tablet 0   No current facility-administered medications on file prior to visit.     The following portions of the patient's history were reviewed and updated as appropriate: allergies, current medications, past family history, past medical history, past social history, past surgical history and problem list.  ROS Otherwise as in subjective above  Objective: BP 120/80   Pulse 99   Wt 217 lb (98.4 kg)   SpO2 98%   BMI 31.14 kg/m   General appearance: alert, no distress, well developed, well nourished    Assessment: Encounter Diagnoses  Name Primary?   Polycythemia Yes   Microscopic hematuria     Mixed dyslipidemia      Plan: Polycythemia-I looked at his hematology notes from a few months ago.  They discussed differential causes but felt like he was not in danger of anything worrisome.  He is donating blood every 56 days.  Last blood donation 2 weeks ago.  He sees hematology next month.  Updated CBC today  Microscopic hematuria-recheck urine labs today.  Mixed dyslipidemia-we discussed his labs from 1 year.  I do recommend he be on statin.  He will start Crestor every other day along with counseling on low-cholesterol diet  Dylan Garner was seen today for follow-up.  Diagnoses and all orders for this visit:  Polycythemia -     CBC with Differential/Platelet  Microscopic hematuria -     POCT Urinalysis DIP (Proadvantage Device)  Mixed dyslipidemia  Other orders -     rosuvastatin (CRESTOR) 10 MG tablet; Take 1 tablet (10 mg total) by mouth every other day.    Follow up: pending labs

## 2022-03-30 LAB — URINALYSIS, MICROSCOPIC ONLY
Bacteria, UA: NONE SEEN
Casts: NONE SEEN /lpf
Epithelial Cells (non renal): NONE SEEN /hpf (ref 0–10)
WBC, UA: NONE SEEN /hpf (ref 0–5)

## 2022-04-04 NOTE — Progress Notes (Signed)
Patient Care Team: Tysinger, Cleda Mccreedy as PCP - General (Family Medicine)  DIAGNOSIS:  Encounter Diagnosis  Name Primary?   Polycythemia, secondary    CHIEF COMPLIANT: Follow-up Polycythemia  INTERVAL HISTORY: Dylan Garner is a 56 y.yo with the above mention. he presents to the clinic today for labs a follow-up. He states that he has been drinking a lot more water at least 60 -70 oz. a day. Denies getting the sleep study done. He is also noticed that his microscopic hematuria has resolved since he started drinking more water.  He feels good and he can manage the fluid intake  His truckdriver job.   ALLERGIES:  has No Known Allergies.  MEDICATIONS:  Current Outpatient Medications  Medication Sig Dispense Refill   lisinopril-hydrochlorothiazide (ZESTORETIC) 10-12.5 MG tablet Take 1 tablet by mouth daily. 90 tablet 3   rosuvastatin (CRESTOR) 10 MG tablet Take 1 tablet (10 mg total) by mouth every other day. 90 tablet 3   No current facility-administered medications for this visit.    PHYSICAL EXAMINATION: ECOG PERFORMANCE STATUS: 0 - Asymptomatic  Vitals:   04/12/22 1135  BP: 133/76  Pulse: 96  Resp: 18  Temp: 97.7 F (36.5 C)  SpO2: 97%   Filed Weights   04/12/22 1135  Weight: 220 lb 11.2 oz (100.1 kg)     LABORATORY DATA:  I have reviewed the data as listed    Latest Ref Rng & Units 10/30/2021    9:05 AM 08/18/2020   11:39 AM 08/31/2018    8:00 PM  CMP  Glucose 70 - 99 mg/dL 99  83  94   BUN 6 - 24 mg/dL 16  12  10    Creatinine 0.76 - 1.27 mg/dL  6.76  1.95   Sodium 134 - 144 mmol/L 139  138  139   Potassium 3.5 - 5.2 mmol/L 4.4  4.4  4.0   Chloride 96 - 106 mmol/L 100  98  104   CO2 20 - 29 mmol/L 23  25  30    Calcium 8.7 - 10.2 mg/dL 9.3  9.4  9.0   Total Protein 6.0 - 8.5 g/dL 7.2  7.3  7.5   Total Bilirubin 0.0 - 1.2 mg/dL 1.3  0.6  1.6   Alkaline Phos 44 - 121 IU/L 66  65  112   AST 0 - 40 IU/L 31  24  242   ALT 0 - 44 IU/L 30  28  463      Lab Results  Component Value Date   WBC 3.7 (L) 04/12/2022   HGB 17.4 (H) 04/12/2022   HCT 50.6 04/12/2022   MCV 88.9 04/12/2022   PLT 229 04/12/2022   NEUTROABS 1.3 (L) 04/12/2022    ASSESSMENT & PLAN:  Polycythemia, secondary Lab review: 02/01/2011: Hemoglobin 14.9, hematocrit 42.6 07/27/2013: Hemoglobin 17.3, hematocrit 54.9 08/31/2018: Hemoglobin 16.8 hematocrit 52.7 08/18/2020: Hemoglobin 18.1, hematocrit 53.9 11/13/2021: Hemoglobin 18.9, hematocrit 55.2 04/12/2022: Hemoglobin 17.4, hematocrit 50.6     Recommendation: 1.  MPN panel: Negative for mutations 2. Erythropoietin level: 7.1 (appropriately suppressed)   The other potential possibility is obstructive sleep apnea.   He is a 01/13/2022 who did not drink enough water previously but he now started drinking plenty of water and therefore his hemoglobin has improved.  He has not done the sleep studies but it is naturally improving therefore we can watch and monitor.  Return to clinic in 6 months with labs    No orders  of the defined types were placed in this encounter.  The patient has a good understanding of the overall plan. he agrees with it. he will call with any problems that may develop before the next visit here. Total time spent: 30 mins including face to face time and time spent for planning, charting and co-ordination of care   Tamsen Meek, MD 04/12/22    I Janan Ridge am scribing for Dr. Pamelia Hoit  I have reviewed the above documentation for accuracy and completeness, and I agree with the above.

## 2022-04-12 ENCOUNTER — Inpatient Hospital Stay: Payer: BC Managed Care – PPO | Attending: Hematology and Oncology

## 2022-04-12 ENCOUNTER — Inpatient Hospital Stay (HOSPITAL_BASED_OUTPATIENT_CLINIC_OR_DEPARTMENT_OTHER): Payer: BC Managed Care – PPO | Admitting: Hematology and Oncology

## 2022-04-12 ENCOUNTER — Other Ambulatory Visit: Payer: Self-pay

## 2022-04-12 DIAGNOSIS — G4733 Obstructive sleep apnea (adult) (pediatric): Secondary | ICD-10-CM | POA: Diagnosis not present

## 2022-04-12 DIAGNOSIS — D751 Secondary polycythemia: Secondary | ICD-10-CM | POA: Diagnosis present

## 2022-04-12 DIAGNOSIS — Z79899 Other long term (current) drug therapy: Secondary | ICD-10-CM | POA: Diagnosis not present

## 2022-04-12 LAB — CBC WITH DIFFERENTIAL (CANCER CENTER ONLY)
Abs Immature Granulocytes: 0.01 10*3/uL (ref 0.00–0.07)
Basophils Absolute: 0 10*3/uL (ref 0.0–0.1)
Basophils Relative: 1 %
Eosinophils Absolute: 0.1 10*3/uL (ref 0.0–0.5)
Eosinophils Relative: 2 %
HCT: 50.6 % (ref 39.0–52.0)
Hemoglobin: 17.4 g/dL — ABNORMAL HIGH (ref 13.0–17.0)
Immature Granulocytes: 0 %
Lymphocytes Relative: 52 %
Lymphs Abs: 1.9 10*3/uL (ref 0.7–4.0)
MCH: 30.6 pg (ref 26.0–34.0)
MCHC: 34.4 g/dL (ref 30.0–36.0)
MCV: 88.9 fL (ref 80.0–100.0)
Monocytes Absolute: 0.4 10*3/uL (ref 0.1–1.0)
Monocytes Relative: 10 %
Neutro Abs: 1.3 10*3/uL — ABNORMAL LOW (ref 1.7–7.7)
Neutrophils Relative %: 35 %
Platelet Count: 229 10*3/uL (ref 150–400)
RBC: 5.69 MIL/uL (ref 4.22–5.81)
RDW: 12.6 % (ref 11.5–15.5)
WBC Count: 3.7 10*3/uL — ABNORMAL LOW (ref 4.0–10.5)
nRBC: 0 % (ref 0.0–0.2)

## 2022-04-12 IMAGING — CT CT ABD-PELV W/ CM
1 of 3 series · 13 of 32 positions shown, 19 images · IV contrast (APPLIED)
Comparison: 09/01/2018 from [HOSPITAL]

CLINICAL DATA: Hematuria. Indeterminate left renal lesion on prior
CT.

Creatinine was obtained on site at [HOSPITAL] at [HOSPITAL].
Results: Creatinine 1.0 mg/dL.
EXAM:
CT ABDOMEN AND PELVIS WITH CONTRAST
TECHNIQUE: Multidetector CT imaging of the abdomen and pelvis was performed
using the standard protocol following bolus administration of
intravenous contrast.
CONTRAST:  100mL WC85CN-IQQ IOPAMIDOL (WC85CN-IQQ) INJECTION 61%

[Series 2: abd/pelvis w/cm · axial · 0.87mm/px · z∈[-475,-60]mm · 13 of 97 slices shown, 19 images]
[im 7/97  soft-tissue]
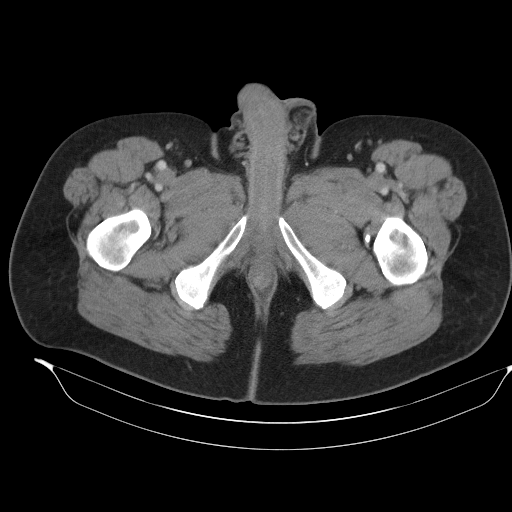
[im 7/97  bone]
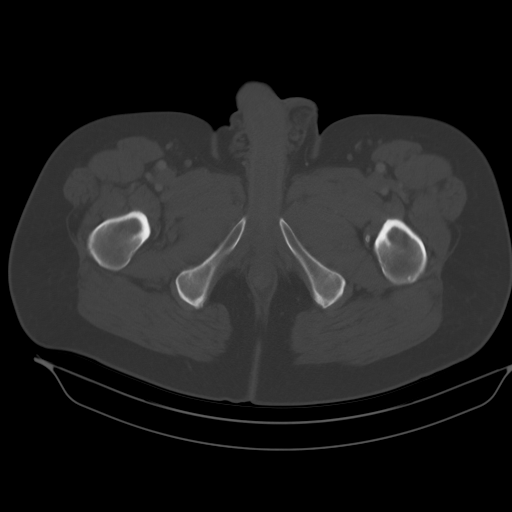
[im 14/97  soft-tissue]
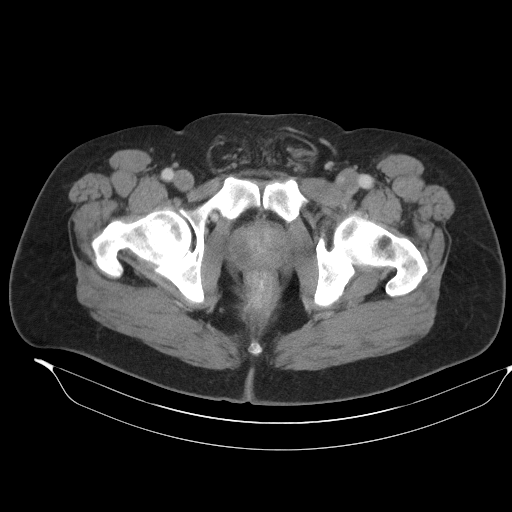
[im 21/97  soft-tissue]
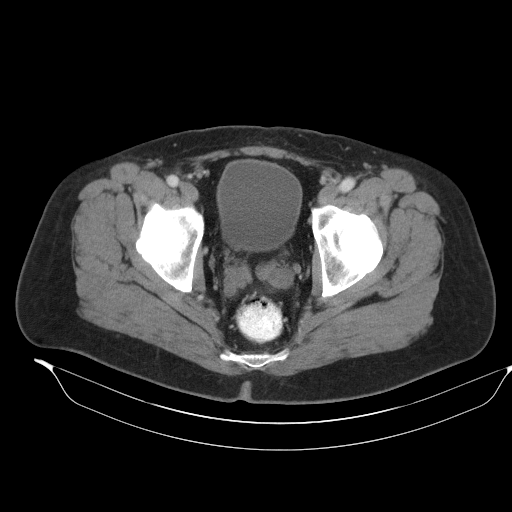
[im 28/97  soft-tissue]
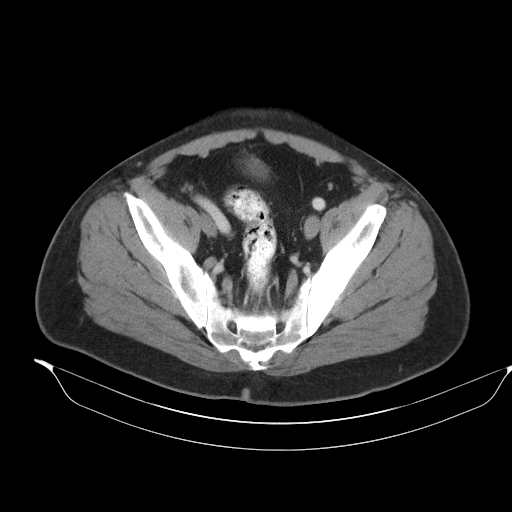
[im 35/97  soft-tissue]
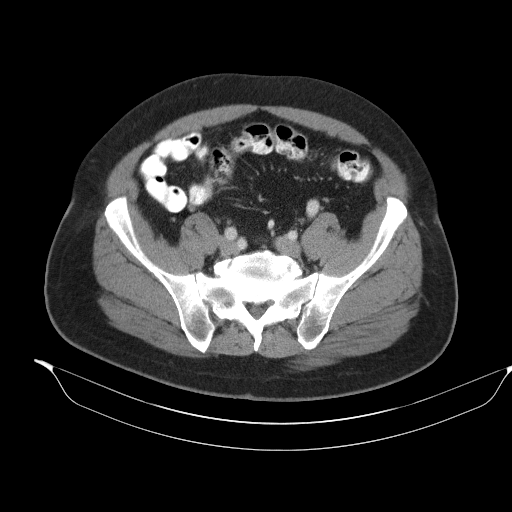
[im 42/97  soft-tissue]
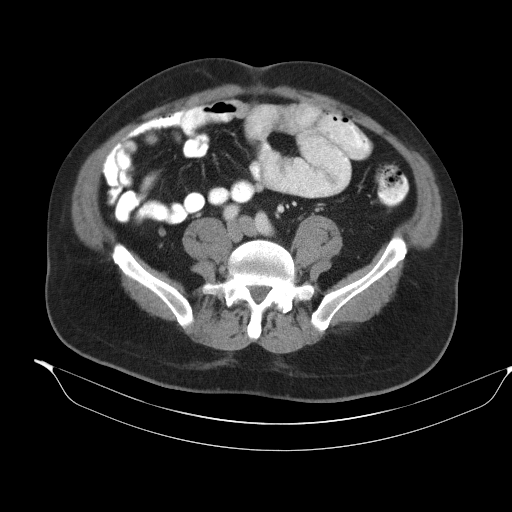
[im 49/97  soft-tissue]
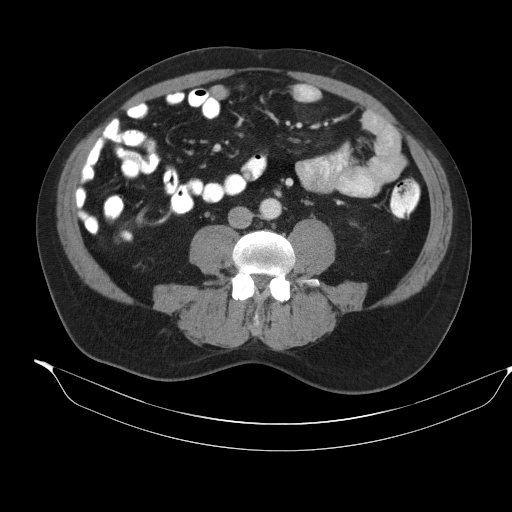
[im 55/97  soft-tissue]
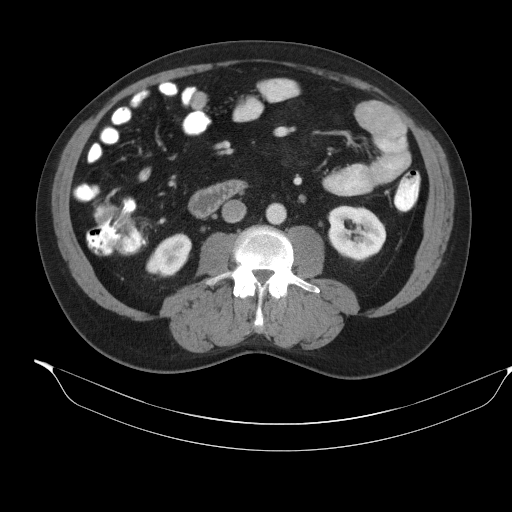
[im 62/97  soft-tissue]
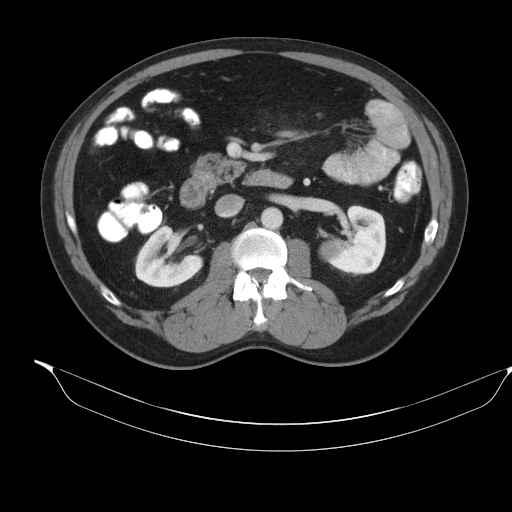
[im 62/97  bone]
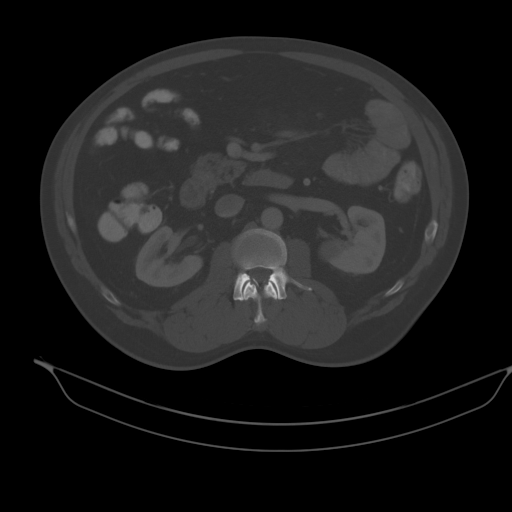
[im 69/97  soft-tissue]
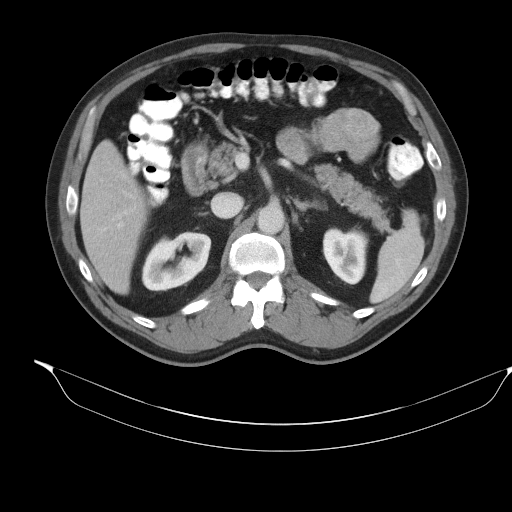
[im 69/97  lung]
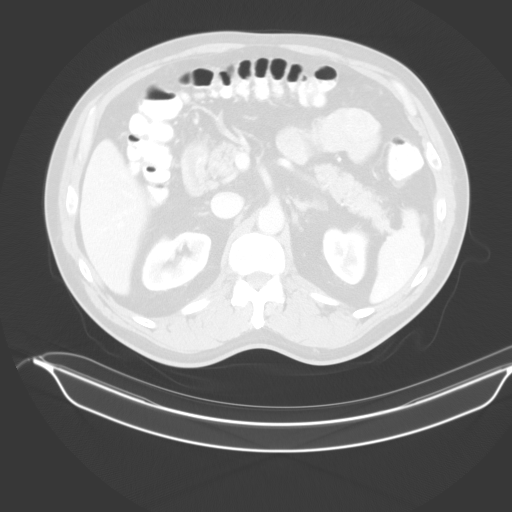
[im 76/97  soft-tissue]
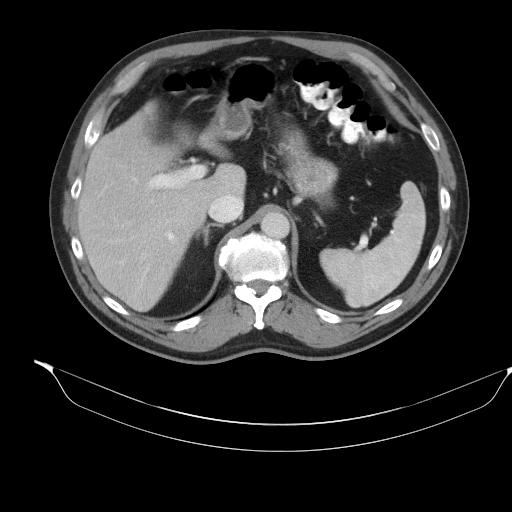
[im 76/97  lung]
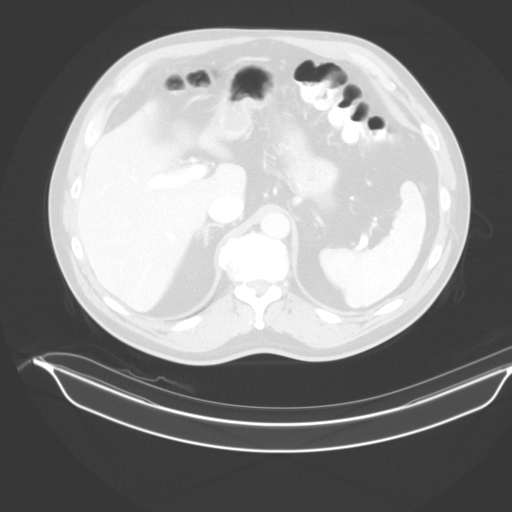
[im 83/97  soft-tissue]
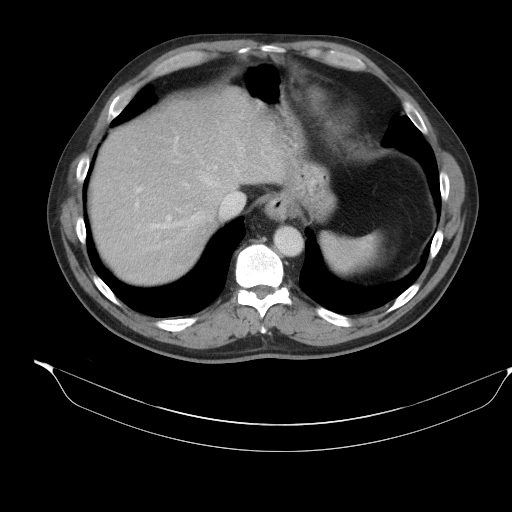
[im 83/97  lung]
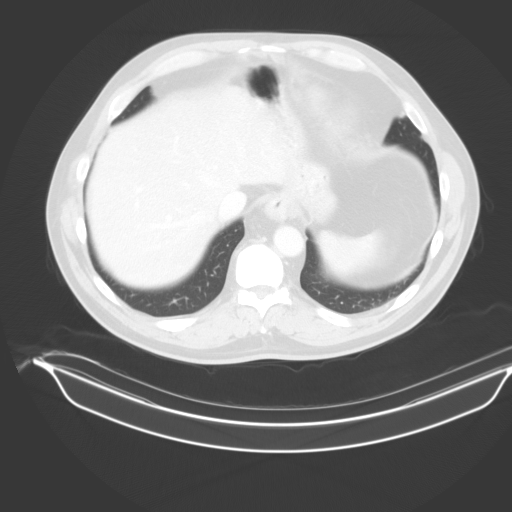
[im 90/97  soft-tissue]
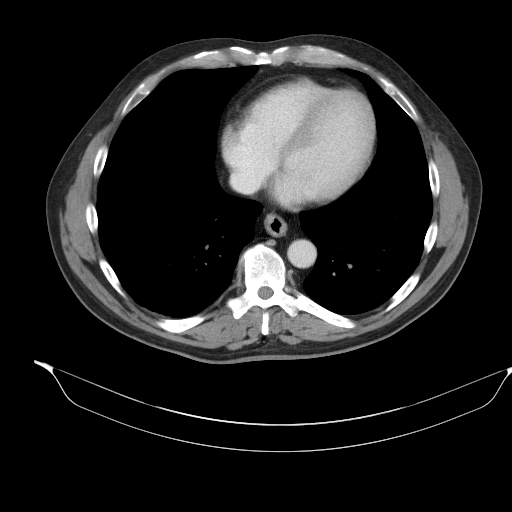
[im 90/97  lung]
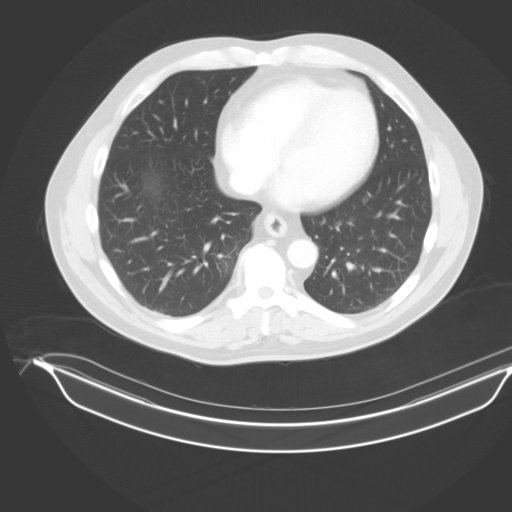

[13 of 32 positions shown; findings below may reference images not displayed]

FINDINGS: Lower Chest: No acute findings.

Hepatobiliary: No hepatic masses identified. Prior cholecystectomy.
No evidence of biliary obstruction.

Pancreas:  No mass or inflammatory changes.

Spleen: Within normal limits in size and appearance.

Adrenals/Urinary Tract: Adrenal glands and right kidney are normal
in appearance. A tiny simple cyst is seen in the upper pole of the
left kidney. A 2.6 cm lesion is seen in the posterior midpole of the
left kidney which shows higher than fluid attenuation, and has
increased in size from 2.3 cm on prior exam. This could represent a
hemorrhagic cyst although a solid renal neoplasm cannot be excluded.
No evidence of ureteral calculi or hydronephrosis.

Stomach/Bowel: No evidence of obstruction, inflammatory process or
abnormal fluid collections. Normal appendix visualized.

Vascular/Lymphatic: No pathologically enlarged lymph nodes. No
abdominal aortic aneurysm.

Reproductive:  No mass or other significant abnormality.

Other:  None.

Musculoskeletal:  No suspicious bone lesions identified.
IMPRESSION: Increased size of 2.6 cm indeterminate left renal lesion, which
could represent a hemorrhagic cyst although a solid renal neoplasm
cannot be excluded. Abdomen MRI without and with contrast is
recommended for further characterization.

No evidence of ureteral calculi or hydronephrosis.

## 2022-04-12 NOTE — Assessment & Plan Note (Addendum)
Lab review: 02/01/2011: Hemoglobin 14.9, hematocrit 42.6 07/27/2013: Hemoglobin 17.3, hematocrit 54.9 08/31/2018: Hemoglobin 16.8 hematocrit 52.7 08/18/2020: Hemoglobin 18.1, hematocrit 53.9 11/13/2021: Hemoglobin 18.9, hematocrit 55.2 04/12/2022: Hemoglobin 17.4, hematocrit 50.6    Recommendation: 1.  MPN panel: Negative for mutations 2. Erythropoietin level: 7.1 (appropriately suppressed)   The other potential possibility is obstructive sleep apnea.  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  He has not done the sleep studies but it is naturally improving therefore we can watch and monitor.  Return to clinic in 6 months with labs

## 2022-10-11 ENCOUNTER — Inpatient Hospital Stay: Payer: BC Managed Care – PPO | Admitting: Hematology and Oncology

## 2022-10-11 ENCOUNTER — Inpatient Hospital Stay: Payer: BC Managed Care – PPO

## 2022-10-15 NOTE — Progress Notes (Signed)
   Patient Care Team: Tysinger, Leward Quan as PCP - General (Family Medicine)  DIAGNOSIS: No diagnosis found.  SUMMARY OF ONCOLOGIC HISTORY: Oncology History   No history exists.    CHIEF COMPLIANT:  Follow-up Polycythemia   INTERVAL HISTORY: Dylan Garner is a 63 y.yo with the above mention. he presents to the clinic today for labs a follow-up    ALLERGIES:  has No Known Allergies.  MEDICATIONS:  Current Outpatient Medications  Medication Sig Dispense Refill   lisinopril-hydrochlorothiazide (ZESTORETIC) 10-12.5 MG tablet Take 1 tablet by mouth daily. 90 tablet 3   rosuvastatin (CRESTOR) 10 MG tablet Take 1 tablet (10 mg total) by mouth every other day. 90 tablet 3   No current facility-administered medications for this visit.    PHYSICAL EXAMINATION: ECOG PERFORMANCE STATUS: {CHL ONC ECOG [phone]}  There were no vitals filed for this visit. There were no vitals filed for this visit.  BREAST:*** No palpable masses or nodules in either right or left breasts. No palpable axillary supraclavicular or infraclavicular adenopathy no breast tenderness or nipple discharge. (exam performed in the presence of a chaperone)  LABORATORY DATA:  I have reviewed the data as listed    Latest Ref Rng & Units 10/30/2021    9:05 AM 08/18/2020   11:39 AM 08/31/2018    8:00 PM  CMP  Glucose 70 - 99 mg/dL 99  83  94   BUN 6 - 24 mg/dL 16  12  10    Creatinine 0.76 - 1.27 mg/dL 1.34  1.26  1.29   Sodium 134 - 144 mmol/L 139  138  139   Potassium 3.5 - 5.2 mmol/L 4.4  4.4  4.0   Chloride 96 - 106 mmol/L 100  98  104   CO2 20 - 29 mmol/L 23  25  30    Calcium 8.7 - 10.2 mg/dL 9.3  9.4  9.0   Total Protein 6.0 - 8.5 g/dL 7.2  7.3  7.5   Total Bilirubin 0.0 - 1.2 mg/dL 1.3  0.6  1.6   Alkaline Phos 44 - 121 IU/L 66  65  112   AST 0 - 40 IU/L 31  24  242   ALT 0 - 44 IU/L 30  28  463     Lab Results  Component Value Date   WBC 3.7 (L) 04/12/2022   HGB 17.4 (H) 04/12/2022   HCT  50.6 04/12/2022   MCV 88.9 04/12/2022   PLT 229 04/12/2022   NEUTROABS 1.3 (L) 04/12/2022    ASSESSMENT & PLAN:  No problem-specific Assessment & Plan notes found for this encounter.    No orders of the defined types were placed in this encounter.  The patient has a good understanding of the overall plan. he agrees with it. he will call with any problems that may develop before the next visit here. Total time spent: 30 mins including face to face time and time spent for planning, charting and co-ordination of care   Suzzette Righter, [place] 10/15/22    I Gardiner Coins am acting as a Education administrator for [employer]  ***

## 2022-10-16 NOTE — Assessment & Plan Note (Signed)
Lab review: 02/01/2011: Hemoglobin 14.9, hematocrit 42.6 07/27/2013: Hemoglobin 17.3, hematocrit 54.9 08/31/2018: Hemoglobin 16.8 hematocrit 52.7 08/18/2020: Hemoglobin 18.1, hematocrit 53.9 11/13/2021: Hemoglobin 18.9, hematocrit 55.2 04/12/2022: Hemoglobin 17.4, hematocrit 50.6 10/18/22: Hemoglobin 17.2, hematocrit 50.8     Recommendation: 1.  MPN panel: Negative for mutations 2. Erythropoietin level: 7.1 (appropriately suppressed)    The other potential possibility is obstructive sleep apnea.   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  He has not done the sleep studies    Return to clinic in 6 months with labs

## 2022-10-18 ENCOUNTER — Other Ambulatory Visit: Payer: Self-pay

## 2022-10-18 ENCOUNTER — Inpatient Hospital Stay: Payer: BC Managed Care – PPO | Attending: Hematology and Oncology

## 2022-10-18 ENCOUNTER — Inpatient Hospital Stay: Payer: BC Managed Care – PPO | Admitting: Hematology and Oncology

## 2022-10-18 VITALS — BP 143/89 | HR 72 | Temp 97.5°F | Resp 18 | Wt 218.5 lb

## 2022-10-18 DIAGNOSIS — Z79899 Other long term (current) drug therapy: Secondary | ICD-10-CM | POA: Diagnosis not present

## 2022-10-18 DIAGNOSIS — D751 Secondary polycythemia: Secondary | ICD-10-CM | POA: Insufficient documentation

## 2022-10-18 DIAGNOSIS — G4733 Obstructive sleep apnea (adult) (pediatric): Secondary | ICD-10-CM | POA: Diagnosis not present

## 2022-10-18 LAB — CBC WITH DIFFERENTIAL (CANCER CENTER ONLY)
Abs Immature Granulocytes: 0 10*3/uL (ref 0.00–0.07)
Basophils Absolute: 0 10*3/uL (ref 0.0–0.1)
Basophils Relative: 1 %
Eosinophils Absolute: 0 10*3/uL (ref 0.0–0.5)
Eosinophils Relative: 1 %
HCT: 50.8 % (ref 39.0–52.0)
Hemoglobin: 17.2 g/dL — ABNORMAL HIGH (ref 13.0–17.0)
Immature Granulocytes: 0 %
Lymphocytes Relative: 56 %
Lymphs Abs: 2 10*3/uL (ref 0.7–4.0)
MCH: 29.4 pg (ref 26.0–34.0)
MCHC: 33.9 g/dL (ref 30.0–36.0)
MCV: 86.7 fL (ref 80.0–100.0)
Monocytes Absolute: 0.4 10*3/uL (ref 0.1–1.0)
Monocytes Relative: 11 %
Neutro Abs: 1.1 10*3/uL — ABNORMAL LOW (ref 1.7–7.7)
Neutrophils Relative %: 31 %
Platelet Count: 228 10*3/uL (ref 150–400)
RBC: 5.86 MIL/uL — ABNORMAL HIGH (ref 4.22–5.81)
RDW: 12.4 % (ref 11.5–15.5)
WBC Count: 3.5 10*3/uL — ABNORMAL LOW (ref 4.0–10.5)
nRBC: 0 % (ref 0.0–0.2)

## 2022-10-23 ENCOUNTER — Other Ambulatory Visit: Payer: Self-pay | Admitting: Medical

## 2022-11-05 ENCOUNTER — Encounter: Payer: BC Managed Care – PPO | Admitting: Medical

## 2022-12-27 ENCOUNTER — Ambulatory Visit: Payer: BC Managed Care – PPO | Admitting: Medical

## 2022-12-27 ENCOUNTER — Encounter: Payer: Self-pay | Admitting: Medical

## 2022-12-27 VITALS — BP 120/68 | HR 83 | Ht 71.0 in | Wt 221.0 lb

## 2022-12-27 DIAGNOSIS — Z7185 Encounter for immunization safety counseling: Secondary | ICD-10-CM

## 2022-12-27 DIAGNOSIS — Z720 Tobacco use: Secondary | ICD-10-CM

## 2022-12-27 DIAGNOSIS — E785 Hyperlipidemia, unspecified: Secondary | ICD-10-CM | POA: Insufficient documentation

## 2022-12-27 DIAGNOSIS — K08109 Complete loss of teeth, unspecified cause, unspecified class: Secondary | ICD-10-CM | POA: Insufficient documentation

## 2022-12-27 DIAGNOSIS — I10 Essential (primary) hypertension: Secondary | ICD-10-CM

## 2022-12-27 DIAGNOSIS — N400 Enlarged prostate without lower urinary tract symptoms: Secondary | ICD-10-CM

## 2022-12-27 DIAGNOSIS — Z972 Presence of dental prosthetic device (complete) (partial): Secondary | ICD-10-CM

## 2022-12-27 DIAGNOSIS — D751 Secondary polycythemia: Secondary | ICD-10-CM

## 2022-12-27 DIAGNOSIS — H579 Unspecified disorder of eye and adnexa: Secondary | ICD-10-CM | POA: Diagnosis not present

## 2022-12-27 DIAGNOSIS — Z0000 Encounter for general adult medical examination without abnormal findings: Secondary | ICD-10-CM

## 2022-12-27 DIAGNOSIS — Z125 Encounter for screening for malignant neoplasm of prostate: Secondary | ICD-10-CM

## 2022-12-27 DIAGNOSIS — Z13220 Encounter for screening for lipoid disorders: Secondary | ICD-10-CM

## 2022-12-27 LAB — POCT URINALYSIS DIP (PROADVANTAGE DEVICE)
Bilirubin, UA: NEGATIVE
Glucose, UA: NEGATIVE mg/dL
Ketones, POC UA: NEGATIVE mg/dL
Leukocytes, UA: NEGATIVE
Nitrite, UA: NEGATIVE
Protein Ur, POC: NEGATIVE mg/dL
Specific Gravity, Urine: 1.01
Urobilinogen, Ur: NEGATIVE
pH, UA: 6 (ref 5.0–8.0)

## 2022-12-27 NOTE — Progress Notes (Signed)
Subjective:   HPI  Dylan Garner is a 57 y.o. male who presents for Chief Complaint  Patient presents with   nonfasting cpe    Nonfasting cpe, bump in right eye, itchy and red. Started 2 days ago. No other concerns, declines shingles and covid    Patient Care Team: Lita Flynn, Kermit Balo, PA-C as PCP - General (Family Medicine) Serena Croissant, MD as Consulting Physician (Hematology and Oncology) Sees dentist   Concerns: Nonfasting today  He has a bump in his right eye x few days, new.  No injury or trauma. Feels like foreign body.  No vision impairment.  HTN - compliant with medication  Hyperlipidemia - compliant with medication  Reviewed their medical, surgical, family, social, medication, and allergy history and updated chart as appropriate.  Past Medical History:  Diagnosis Date   Colon cancer screening 09/2020   negative cologuard   Hyperlipidemia    Hypertension     Past Surgical History:  Procedure Laterality Date   CHOLECYSTECTOMY     MOUTH SURGERY     dental implants    Family History  Problem Relation Age of Onset   Cataracts Mother    Hypertension Father    Hypertension Sister    Hypertension Brother    Hypertension Brother    Cancer Neg Hx    Heart disease Neg Hx    Stroke Neg Hx    Diabetes Neg Hx      Current Outpatient Medications:    lisinopril-hydrochlorothiazide (ZESTORETIC) 10-12.5 MG tablet, TAKE 1 TABLET BY MOUTH EVERY DAY, Disp: 90 tablet, Rfl: 1   rosuvastatin (CRESTOR) 10 MG tablet, Take 1 tablet (10 mg total) by mouth every other day., Disp: 90 tablet, Rfl: 3  No Known Allergies  Review of Systems  Constitutional:  Negative for chills, fever, malaise/fatigue and weight loss.  HENT:  Negative for congestion, ear pain, hearing loss, sore throat and tinnitus.   Eyes:  Negative for blurred vision, pain and redness.       Growth right eye few days ago  Respiratory:  Negative for cough, hemoptysis and shortness of breath.   Cardiovascular:   Negative for chest pain, palpitations, orthopnea, claudication and leg swelling.  Gastrointestinal:  Negative for abdominal pain, blood in stool, constipation, diarrhea, nausea and vomiting.  Genitourinary:  Negative for dysuria, flank pain, frequency, hematuria and urgency.  Musculoskeletal:  Negative for falls, joint pain and myalgias.  Skin:  Negative for itching and rash.  Neurological:  Negative for dizziness, tingling, speech change, weakness and headaches.  Endo/Heme/Allergies:  Negative for polydipsia. Does not bruise/bleed easily.  Psychiatric/Behavioral:  Negative for depression and memory loss. The patient is not nervous/anxious and does not have insomnia.      Objective:  BP 120/68   Pulse 83   Ht 5\' 11"  (1.803 m)   Wt 221 lb (100.2 kg)   BMI 30.82 kg/m   Wt Readings from Last 3 Encounters:  12/27/22 221 lb (100.2 kg)  10/18/22 218 lb 8 oz (99.1 kg)  04/12/22 220 lb 11.2 oz (100.1 kg)   BP Readings from Last 3 Encounters:  12/27/22 120/68  10/18/22 (!) 143/89  04/12/22 133/76    General appearance: alert, no distress, WD/WN Skin: unremarkable, there is a round brown slightly raised 4 mm benign-appearing lesion on his left upper buttock HEENT: medial right eye with a 4mm x 3mm raised somewhat pearly lesion with vascularity, otherwise eye unremarkable, had normocephalic, conjunctiva/corneas normal, sclerae anicteric, PERRLA, EOMi Neck: supple, no lymphadenopathy,  no thyromegaly, no masses, normal ROM, no bruits Chest: non tender, normal shape and expansion Heart: RRR, normal S1, S2, no murmurs Lungs: CTA bilaterally, no wheezes, rhonchi, or rales Abdomen: +bs, soft, non tender, non distended, no masses, no hepatomegaly, no splenomegaly, no bruits Back: non tender, normal ROM, no scoliosis Musculoskeletal: upper extremities non tender, no obvious deformity, normal ROM throughout, lower extremities non tender, no obvious deformity, normal ROM throughout Extremities:  no edema, no cyanosis, no clubbing Pulses: 2+ symmetric, upper and lower extremities, normal cap refill Neurological: alert, oriented x 3, CN2-12 intact, strength normal upper extremities and lower extremities, sensation normal throughout, DTRs 2+ throughout, no cerebellar signs, gait normal Psychiatric: normal affect, behavior normal, pleasant  GU: declined Rectal: declines exam    Assessment and Plan :   Encounter Diagnoses  Name Primary?   Encounter for health maintenance examination in adult Yes   Eye lesion    Vaccine counseling    Screening for prostate cancer    Polycythemia, secondary    Chewing tobacco use    Hyperlipidemia, unspecified hyperlipidemia type    Essential hypertension, benign    Enlarged prostate    Screening for lipid disorders    Full dentures      Today you had a preventative care visit or wellness visit.    Topics today may have included healthy lifestyle, diet, exercise, preventative care, vaccinations, sick and well care, proper use of emergency dept and after hours care, as well as other concerns.     Recommendations: Continue to return yearly for your annual wellness and preventative care visits.  This gives [place] a chance to discuss healthy lifestyle, exercise, vaccinations, review your chart record, and perform screenings where appropriate.  I recommend you see your eye doctor yearly for routine vision care.  I recommend you see your dentist yearly for routine dental care including hygiene visits twice yearly.   Vaccination recommendations were reviewed Immunization History  Administered Date(s) Administered   PFIZER(Purple Top)SARS-COV-2 Vaccination 03/31/2020, 04/21/2020   Tdap 08/18/2020   Shingles vaccine - declines   Screening for cancer: Colon cancer screening:  Cologuard negative 09/08/20  We discussed PSA, prostate exam, and prostate cancer screening risks/benefits.     Skin cancer screening: Check your skin regularly for new  changes, growing lesions, or other lesions of concern Come in for evaluation if you have skin lesions of concern.  Lung cancer screening: If you have a greater than 30 pack year history of tobacco use, then you qualify for lung cancer screening with a chest CT scan  We currently don't have screenings for other cancers besides breast, cervical, colon, and lung cancers.  If you have a strong family history of cancer or have other cancer screening concerns, please let me know.    Bone health: Get at least 150 minutes of aerobic exercise weekly Get weight bearing exercise at least once weekly   Heart health: Get at least 150 minutes of aerobic exercise weekly Limit alcohol It is important to maintain a healthy blood pressure and healthy cholesterol numbers  Consider CT coronary test screening   Separate significant issues discussed: Hypertension - your blood pressure looks great.  Continue current medication  Hyperlipidemia - continue statin  Return next week fasting for labs when you are off  Lesion of right eye -we discussed possible causes.  We were able to call and get him worked into an eye doctor office today  [place] was seen today for nonfasting cpe.  Diagnoses and  all orders for this visit:  Encounter for health maintenance examination in adult -     Comprehensive metabolic panel; Future -     Lipid panel; Future -     PSA; Future -     POCT Urinalysis DIP (Proadvantage Device)  Eye lesion  Vaccine counseling  Screening for prostate cancer -     PSA; Future  Polycythemia, secondary  Chewing tobacco use  Hyperlipidemia, unspecified hyperlipidemia type  Essential hypertension, benign  Enlarged prostate  Screening for lipid disorders -     Lipid panel; Future  Full dentures      Follow-up pending labs, yearly for physical

## 2023-01-28 ENCOUNTER — Other Ambulatory Visit: Payer: Self-pay | Admitting: Medical

## 2023-06-01 ENCOUNTER — Other Ambulatory Visit: Payer: Self-pay | Admitting: Medical

## 2023-08-30 ENCOUNTER — Other Ambulatory Visit: Payer: Self-pay | Admitting: Family Medicine

## 2023-09-05 ENCOUNTER — Telehealth: Payer: Self-pay | Admitting: Hematology and Oncology

## 2023-09-05 NOTE — Telephone Encounter (Signed)
Left patient a voicemail in regards to rescheduled appointment times/dates due to provider being out of office

## 2023-10-24 ENCOUNTER — Ambulatory Visit: Payer: BC Managed Care – PPO | Admitting: Hematology and Oncology

## 2023-10-24 NOTE — Assessment & Plan Note (Deleted)
 Lab review: 02/01/2011: Hemoglobin 14.9, hematocrit 42.6 07/27/2013: Hemoglobin 17.3, hematocrit 54.9 08/31/2018: Hemoglobin 16.8 hematocrit 52.7 08/18/2020: Hemoglobin 18.1, hematocrit 53.9 11/13/2021: Hemoglobin 18.9, hematocrit 55.2 04/12/2022: Hemoglobin 17.4, hematocrit 50.6 10/18/22: Hemoglobin 17.2, hematocrit 50.8   Recommendation: 1.  MPN panel: Negative for mutations 2. Erythropoietin level: 7.1 (appropriately suppressed)  3.  Current treatment: Every 3 month phlebotomy (sent a new prescription with the patient to do this at [facility])   The other potential possibility is obstructive sleep apnea.   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  He has not done the sleep studies    Return to clinic in 1 year with labs

## 2023-10-25 ENCOUNTER — Inpatient Hospital Stay: Payer: BC Managed Care – PPO | Admitting: Hematology and Oncology

## 2023-10-25 DIAGNOSIS — D751 Secondary polycythemia: Secondary | ICD-10-CM

## 2023-11-27 ENCOUNTER — Other Ambulatory Visit: Payer: Self-pay | Admitting: Medical

## 2023-11-28 NOTE — Telephone Encounter (Signed)
 Patient is due for a visit after 12/27/23. I will send in 30 only. Please call to schedule patient

## 2023-11-28 NOTE — Telephone Encounter (Signed)
 Left message for pt need to schedule CPE-lm 11/28/23

## 2023-12-24 ENCOUNTER — Other Ambulatory Visit: Payer: Self-pay | Admitting: Medical

## 2023-12-26 NOTE — Telephone Encounter (Signed)
 Patient is overdue for a visit. Please scheduled. Once scheduled I can refill

## 2024-02-13 ENCOUNTER — Other Ambulatory Visit: Payer: Self-pay | Admitting: Medical

## 2024-02-13 NOTE — Telephone Encounter (Signed)
 Patient is overdue for a visit. Please schedule with Dylan Garner before I can refill his medication

## 2024-02-13 NOTE — Telephone Encounter (Signed)
 Left message pt to call and sent up  cpe with [facility]

## 2024-02-26 ENCOUNTER — Other Ambulatory Visit: Payer: Self-pay | Admitting: Family Medicine

## 2024-02-28 NOTE — Telephone Encounter (Signed)
Left message needs appt.
